# Patient Record
Sex: Female | Born: 1970 | Race: Black or African American | Hispanic: No | Marital: Married | Smoking: Never smoker
Health system: Southern US, Community
[De-identification: ages and names within clinical notes are randomized; demographics above are authoritative.]

## PROBLEM LIST (undated history)

## (undated) DIAGNOSIS — E7800 Pure hypercholesterolemia, unspecified: Secondary | ICD-10-CM

## (undated) DIAGNOSIS — I10 Essential (primary) hypertension: Secondary | ICD-10-CM

## (undated) DIAGNOSIS — R7303 Prediabetes: Secondary | ICD-10-CM

## (undated) HISTORY — DX: Prediabetes: R73.03

---

## 1999-12-01 ENCOUNTER — Emergency Department (HOSPITAL_COMMUNITY): Admission: EM | Admit: 1999-12-01 | Discharge: 1999-12-01 | Payer: Self-pay | Admitting: Emergency Medicine

## 2001-04-07 ENCOUNTER — Ambulatory Visit (HOSPITAL_COMMUNITY): Admission: RE | Admit: 2001-04-07 | Discharge: 2001-04-07 | Payer: Self-pay | Admitting: *Deleted

## 2001-04-09 ENCOUNTER — Encounter: Admission: RE | Admit: 2001-04-09 | Discharge: 2001-04-09 | Payer: Self-pay | Admitting: Obstetrics & Gynecology

## 2001-04-23 ENCOUNTER — Encounter: Admission: RE | Admit: 2001-04-23 | Discharge: 2001-04-23 | Payer: Self-pay | Admitting: *Deleted

## 2001-04-30 ENCOUNTER — Encounter: Admission: RE | Admit: 2001-04-30 | Discharge: 2001-04-30 | Payer: Self-pay | Admitting: *Deleted

## 2001-05-07 ENCOUNTER — Encounter: Admission: RE | Admit: 2001-05-07 | Discharge: 2001-05-07 | Payer: Self-pay | Admitting: *Deleted

## 2001-05-20 ENCOUNTER — Ambulatory Visit (HOSPITAL_COMMUNITY): Admission: RE | Admit: 2001-05-20 | Discharge: 2001-05-20 | Payer: Self-pay | Admitting: *Deleted

## 2001-05-21 ENCOUNTER — Encounter: Admission: RE | Admit: 2001-05-21 | Discharge: 2001-05-21 | Payer: Self-pay | Admitting: *Deleted

## 2001-06-04 ENCOUNTER — Encounter: Admission: RE | Admit: 2001-06-04 | Discharge: 2001-06-04 | Payer: Self-pay | Admitting: *Deleted

## 2001-06-25 ENCOUNTER — Encounter: Admission: RE | Admit: 2001-06-25 | Discharge: 2001-06-25 | Payer: Self-pay | Admitting: *Deleted

## 2001-07-09 ENCOUNTER — Encounter: Admission: RE | Admit: 2001-07-09 | Discharge: 2001-07-09 | Payer: Self-pay | Admitting: *Deleted

## 2001-07-16 ENCOUNTER — Encounter: Admission: RE | Admit: 2001-07-16 | Discharge: 2001-07-16 | Payer: Self-pay | Admitting: *Deleted

## 2001-07-23 ENCOUNTER — Ambulatory Visit (HOSPITAL_COMMUNITY): Admission: RE | Admit: 2001-07-23 | Discharge: 2001-07-23 | Payer: Self-pay | Admitting: *Deleted

## 2001-07-30 ENCOUNTER — Encounter: Admission: RE | Admit: 2001-07-30 | Discharge: 2001-07-30 | Payer: Self-pay | Admitting: *Deleted

## 2001-08-06 ENCOUNTER — Encounter: Admission: RE | Admit: 2001-08-06 | Discharge: 2001-08-06 | Payer: Self-pay | Admitting: *Deleted

## 2001-08-13 ENCOUNTER — Encounter: Admission: RE | Admit: 2001-08-13 | Discharge: 2001-08-13 | Payer: Self-pay | Admitting: *Deleted

## 2001-08-20 ENCOUNTER — Encounter: Payer: Self-pay | Admitting: *Deleted

## 2001-08-20 ENCOUNTER — Inpatient Hospital Stay (HOSPITAL_COMMUNITY): Admission: AD | Admit: 2001-08-20 | Discharge: 2001-08-20 | Payer: Self-pay | Admitting: *Deleted

## 2001-08-20 ENCOUNTER — Encounter: Admission: RE | Admit: 2001-08-20 | Discharge: 2001-08-20 | Payer: Self-pay | Admitting: *Deleted

## 2001-08-20 ENCOUNTER — Encounter (HOSPITAL_COMMUNITY): Admission: RE | Admit: 2001-08-20 | Discharge: 2001-08-20 | Payer: Self-pay | Admitting: *Deleted

## 2001-08-27 ENCOUNTER — Encounter: Admission: RE | Admit: 2001-08-27 | Discharge: 2001-08-27 | Payer: Self-pay | Admitting: *Deleted

## 2001-08-30 ENCOUNTER — Inpatient Hospital Stay (HOSPITAL_COMMUNITY): Admission: AD | Admit: 2001-08-30 | Discharge: 2001-09-04 | Payer: Self-pay | Admitting: *Deleted

## 2001-08-30 ENCOUNTER — Encounter (INDEPENDENT_AMBULATORY_CARE_PROVIDER_SITE_OTHER): Payer: Self-pay

## 2002-01-15 ENCOUNTER — Emergency Department (HOSPITAL_COMMUNITY): Admission: EM | Admit: 2002-01-15 | Discharge: 2002-01-15 | Payer: Self-pay | Admitting: Emergency Medicine

## 2003-09-14 ENCOUNTER — Emergency Department (HOSPITAL_COMMUNITY): Admission: EM | Admit: 2003-09-14 | Discharge: 2003-09-14 | Payer: Self-pay | Admitting: Emergency Medicine

## 2004-09-10 ENCOUNTER — Emergency Department (HOSPITAL_COMMUNITY): Admission: EM | Admit: 2004-09-10 | Discharge: 2004-09-10 | Payer: Self-pay | Admitting: Emergency Medicine

## 2005-03-06 ENCOUNTER — Ambulatory Visit (HOSPITAL_COMMUNITY): Admission: RE | Admit: 2005-03-06 | Discharge: 2005-03-06 | Payer: Self-pay | Admitting: *Deleted

## 2005-04-17 ENCOUNTER — Ambulatory Visit (HOSPITAL_COMMUNITY): Admission: RE | Admit: 2005-04-17 | Discharge: 2005-04-17 | Payer: Self-pay | Admitting: *Deleted

## 2006-07-09 ENCOUNTER — Emergency Department (HOSPITAL_COMMUNITY): Admission: EM | Admit: 2006-07-09 | Discharge: 2006-07-09 | Payer: Self-pay | Admitting: Emergency Medicine

## 2009-05-10 ENCOUNTER — Emergency Department (HOSPITAL_COMMUNITY): Admission: EM | Admit: 2009-05-10 | Discharge: 2009-05-10 | Payer: Self-pay | Admitting: Emergency Medicine

## 2010-01-07 ENCOUNTER — Emergency Department (HOSPITAL_COMMUNITY): Admission: EM | Admit: 2010-01-07 | Discharge: 2010-01-07 | Payer: Self-pay | Admitting: Emergency Medicine

## 2010-06-04 LAB — COMPREHENSIVE METABOLIC PANEL
AST: 17 U/L (ref 0–37)
Albumin: 4 g/dL (ref 3.5–5.2)
BUN: 9 mg/dL (ref 6–23)
CO2: 25 mEq/L (ref 19–32)
Calcium: 9.6 mg/dL (ref 8.4–10.5)
Chloride: 105 mEq/L (ref 96–112)
Creatinine, Ser: 0.84 mg/dL (ref 0.4–1.2)
GFR calc Af Amer: >60 mL/min (ref >60)
GFR calc non Af Amer: >60 mL/min (ref >60)
Sodium: 138 mEq/L (ref 135–145)
Total Protein: 7.8 g/dL (ref 6.0–8.3)

## 2010-06-04 LAB — POCT CARDIAC MARKERS: Troponin i, poc: <0.05 ng/mL (ref 0.00–0.09)

## 2010-06-04 LAB — CBC
MCHC: 33.6 g/dL (ref 30.0–36.0)
MCV: 84.5 fL (ref 78.0–100.0)
RBC: 4.14 MIL/uL (ref 3.87–5.11)

## 2010-06-04 LAB — DIFFERENTIAL
Lymphocytes Relative: 35 % (ref 12–46)
Lymphs Abs: 2.8 10*3/uL (ref 0.7–4.0)
Monocytes Absolute: 0.7 10*3/uL (ref 0.1–1.0)

## 2010-07-28 NOTE — H&P (Signed)
[facility]  Patient:    Brianna Serrano, Brianna Serrano Visit Number: [id number] MRN: [id number]          Service Type: OBS Location: [facility] Attending Physician:  Michaelle Copas Dictated by:   Clement Husbands, M.D. Admit Date:  08/30/2001                           History and Physical  PREOPERATIVE HISTORY AND PHYSICAL  HISTORY OF PRESENT ILLNESS:   This 40 year old black female, gravida 7, para 5-0-1-5, last menstrual period November 29, 2000, with an [facility] based on a 19-odd-week ultrasound of August 26, 2001, was followed in the high-risk clinic. She is a gestational diabetic, diet controlled.  LABORATORY WORK:              Blood type O positive, negative antibody screen. Rubella immune.  Hemoglobin negative.  Syphilis nonreactive.  Gonorrhea negative.  Chlamydia negative.  GBS negative.  Sickle cell negative.  The patient presented to triage in early latent labor.  The cervix at that time was 2-cm dilated, 75% effacement, vertex at -3 station.  During the afternoon because an augmentation of labor was instituted.  At 5:10 she was having contractions every 2-1/2 to 5 minutes on 4 MU of Pitocin.  Cervix was 2- to 3-cm dilated, 70-80% effacement, vertex still at -3 station.  She had spontaneous rupture of membranes of clear fluid at 7:07 p.m.  She was contracting every 2-3 minutes.  Variables to the 90-110 beats per minute and had recently started with her last contractions.  Position change was instituted.  Her cervix was 3 cm, 70% effaced, -2.  Variable decelerations continued with each contraction to the 85-95 beat per minute range.  There was good variability.  At 8:40 p.m. she was 7+ cm dilated.  Molding was noted. She was thought to be in a transverse position.  Intrauterine pressure catheter was inserted and was internal scalp electrode.  At 11:30 p.m. the cervix was more swollen and the cervix could be felt all the way around. There was more  molding, the vertex was at -1/0 station.  Because the variable decelerations were deeper, Pitocin was discontinued.  Attempts were made over the next few hours to change position in hopes of getting the head to rotate. It was thought to be LOP position.  The variable decelerations improved somewhat, but did pretty much persist.  It would sometimes go to the 80-90 beat per minute range and sometimes to the 60-70 beat per minute range. Variability was considered good.  Her exam at 3:15 a.m. on June 22, revealed the cervix to be 8-9 cm and more edematous anterior.  There was more caput molding and there was suture overlap noted.  She was prepared for cesarean section.  PHYSICAL EXAMINATION:  VITAL SIGNS:                  Blood pressure normal.  CHEST:                        Clear to auscultation and percussion.  HEART:                        Regular sinus rhythm without murmurs.  ABDOMEN:                      Gravid.  Fetal heart rates are noted  above.  VAGINAL EXAMINATION:          Likewise noted above.  DIAGNOSES: 1. Term pregnancy at 40-5/7ths weeks. 2. Arrest at active phase of labor with persistent occipitoposterior    presentation and variable decelerations. Dictated by:   Clement Husbands, M.D. Attending Physician:  Michaelle Copas DD:  08/31/01 TD:  08/31/01 Job: [id number]

## 2010-07-28 NOTE — Op Note (Signed)
[facility]  Patient:    Brianna Serrano, PAIS Visit Number: [id number] MRN: [id number]          Service Type: ANT Location: [facility] Attending Physician:  Michaelle Copas Dictated by:   Clement Husbands, M.D. Proc. Date: 08/31/01 Admit Date:  08/20/2001 Discharge Date: 08/20/2001                             Operative Report  PREOPERATIVE DIAGNOSES:       1. Term pregnancy.                               2. Arrest of active phase of labor with                                  persistent occiput presentation with variable                                  decelerations.  POSTOPERATIVE DIAGNOSES:      1. Term pregnancy.                               2. Arrest of active phase of labor with                                  persistent occiput presentation with variable                                  decelerations.  OPERATION:                    Low transverse cervical cesarean section.  SURGEON:                      Clement Husbands, M.D., Conni Elliot, M.D.  ANESTHESIA:                   Epidural.  PROCEDURE:                    With the patient under satisfactory epidural anesthesia in supine position leaning slightly to her left, already catheterized, the abdomen was prepped and draped.  A low abdominal transverse skin incision was made and carried down through a thin subcutaneous layer to the rectus fascia which was sharply and transversely divided.  Peritoneal cavity was entered.  It should be noted that the skin incision was made three fingerbreadths above the pubic symphysis.  The bladder reflection was higher than normal.  The vesicouterine peritoneum was transversely incised and the bladder pushed inferiorly.  Lower segment transverse uterine incision was made and was extended bilaterally with the bandage scissors.  The lower segment was fairly much thinned out.  Vertex was noted to be LOP position.  The head was easily delivered  followed by the rest of the baby.  Spontaneous respirations and crying were noted.  Cord was doubly clamped and divided.  Infant shown to the mother and passed on to the pediatricians.  Cord blood was obtained as was  blood for arterial cord pH.  Placenta was manually removed.  Uterus was explored and was normal.  The uterus was lifted out of the abdominal cavity.  Uterine incision was closed with a running locking 0 Vicryl suture.  Second layer embrocated the first.  One bleeding site towards the left side required a little extra suturing.  Suture line was then irrigated.  Hemostasis was good.  1 and 2-0 Vicryl on a small needle was used suture one small bleeder down by the bladder.  Fallopian tubes and ovaries were visualized and were normal.  The uterus was repositioned in the abdominal cavity.  Vesicouterine peritoneum was reapproximated with a running 2-0 Vicryl suture.  Suture line was again irrigated.  Hemostasis was good.  Anterior peritoneum was closed with a running 0 Vicryl.  The recti muscles at the lower part of the incision were approximated with interrupted Vicryl suture.  Rectus fascia closed with two segments of running 0 Vicryl suture. Subcutaneous tissue layer was dry.  Skin edge approximated with wide skin staples.  Estimated blood loss 700-800 cc.  Sponge and needle count was correct.  The patient tolerated the procedure well.  Was returned to the recovery room in satisfactory condition. Dictated by:   Clement Husbands, M.D. Attending Physician:  Michaelle Copas DD:  08/31/01 TD:  09/01/01 Job: [id number]

## 2010-07-28 NOTE — Discharge Summary (Signed)
   NAMETASHEEMA, PERRONE NO.:  [id number]   MEDICAL RECORD NO.:  [id number]                   PATIENT TYPE:  NP   LOCATION:  9102                                 FACILITY:  [facility]   PHYSICIAN:  Clement Husbands, M.D.         DATE OF BIRTH:  10-Oct-1970   DATE OF ADMISSION:  08/31/2001  DATE OF DISCHARGE:  09/04/2001                                 DISCHARGE SUMMARY   HOSPITAL COURSE:  [age]-year-old black female, gravida 7, para 5-0-1-5, was  admitted June 22nd with a term pregnancy of 40-5/7 weeks.  She had been  followed in the high-risk clinic.  The patient is a gestational diabetic,  diet controlled.  Prenatal lab work as well as labor course are in the  dictated history and physical exam.  With a diagnosis of arrest of active  phase of labor, with a persistent occipitoposterior presentation and  variable decelerations, she underwent a low transverse cervical cesarean  section with delivery of a 6-pound 10-ounce female infant with Apgar of 8/8.  Arterial cord pH was 7.31.  Postoperatively, she had what was thought to be  a little bit of endometriosis; she had a low-grade fever.  She was treated  with antibiotics.  She did well, progressed to a regular diet and ambulation  as well as passing flatus.  She was discharged on her fourth postoperative  day in an improved condition, to be seen in [facility] in four to six  weeks.  The incision was healing well and skin staples were removed by the  nurse.   DISCHARGE MEDICATIONS:  Given prescription for Percocet and was continued on  antibiotics, Augmentin 500 mg, to take three times a day.   FINAL DIAGNOSES:  1. Term pregnancy.  2. Arrest of active phase of labor with persistent occipitoposterior     presentation with variable decelerations.                                               Clement Husbands, M.D.    EFR/MEDQ  D:  10/09/2001  T:  10/15/2001  Job:  [id number]

## 2010-12-19 ENCOUNTER — Emergency Department (HOSPITAL_COMMUNITY)
Admission: EM | Admit: 2010-12-19 | Discharge: 2010-12-19 | Disposition: A | Discharge status: Home / Self Care | Payer: No Typology Code available for payment source | Attending: Emergency Medicine | Admitting: Emergency Medicine

## 2010-12-19 DIAGNOSIS — M62838 Other muscle spasm: Secondary | ICD-10-CM | POA: Insufficient documentation

## 2010-12-19 DIAGNOSIS — M545 Low back pain, unspecified: Secondary | ICD-10-CM | POA: Insufficient documentation

## 2010-12-19 DIAGNOSIS — R51 Headache: Secondary | ICD-10-CM | POA: Insufficient documentation

## 2010-12-19 DIAGNOSIS — T1490XA Injury, unspecified, initial encounter: Secondary | ICD-10-CM | POA: Insufficient documentation

## 2010-12-19 DIAGNOSIS — Y92410 Unspecified street and highway as the place of occurrence of the external cause: Secondary | ICD-10-CM | POA: Insufficient documentation

## 2011-07-30 ENCOUNTER — Other Ambulatory Visit (HOSPITAL_COMMUNITY): Payer: Self-pay | Admitting: Physician Assistant

## 2011-07-30 DIAGNOSIS — Z1231 Encounter for screening mammogram for malignant neoplasm of breast: Secondary | ICD-10-CM

## 2011-07-31 ENCOUNTER — Ambulatory Visit (HOSPITAL_COMMUNITY)
Admission: RE | Admit: 2011-07-31 | Discharge: 2011-07-31 | Disposition: A | Discharge status: Home / Self Care | Payer: Self-pay | Source: Ambulatory Visit | Attending: Physician Assistant | Admitting: Physician Assistant

## 2011-07-31 DIAGNOSIS — Z1231 Encounter for screening mammogram for malignant neoplasm of breast: Secondary | ICD-10-CM

## 2011-12-24 ENCOUNTER — Other Ambulatory Visit: Payer: Self-pay | Admitting: Family Medicine

## 2011-12-24 DIAGNOSIS — R102 Pelvic and perineal pain: Secondary | ICD-10-CM

## 2011-12-24 DIAGNOSIS — D369 Benign neoplasm, unspecified site: Secondary | ICD-10-CM

## 2011-12-28 ENCOUNTER — Ambulatory Visit
Admission: RE | Admit: 2011-12-28 | Discharge: 2011-12-28 | Disposition: A | Discharge status: Home / Self Care | Payer: No Typology Code available for payment source | Source: Ambulatory Visit | Attending: Family Medicine | Admitting: Family Medicine

## 2011-12-28 DIAGNOSIS — R102 Pelvic and perineal pain: Secondary | ICD-10-CM

## 2011-12-28 DIAGNOSIS — D369 Benign neoplasm, unspecified site: Secondary | ICD-10-CM

## 2015-09-07 ENCOUNTER — Other Ambulatory Visit: Payer: Self-pay | Admitting: Infectious Disease

## 2015-09-07 DIAGNOSIS — Z1231 Encounter for screening mammogram for malignant neoplasm of breast: Secondary | ICD-10-CM

## 2015-09-08 ENCOUNTER — Ambulatory Visit
Admission: RE | Admit: 2015-09-08 | Discharge: 2015-09-08 | Disposition: A | Discharge status: Home / Self Care | Payer: No Typology Code available for payment source | Source: Ambulatory Visit | Attending: Infectious Disease | Admitting: Infectious Disease

## 2015-09-08 DIAGNOSIS — Z1231 Encounter for screening mammogram for malignant neoplasm of breast: Secondary | ICD-10-CM

## 2015-09-14 ENCOUNTER — Other Ambulatory Visit: Payer: Self-pay | Admitting: Infectious Disease

## 2015-09-14 DIAGNOSIS — R928 Other abnormal and inconclusive findings on diagnostic imaging of breast: Secondary | ICD-10-CM

## 2015-09-19 ENCOUNTER — Other Ambulatory Visit: Payer: Self-pay | Admitting: Nurse Practitioner

## 2015-09-19 DIAGNOSIS — R928 Other abnormal and inconclusive findings on diagnostic imaging of breast: Secondary | ICD-10-CM

## 2015-09-20 ENCOUNTER — Other Ambulatory Visit: Payer: No Typology Code available for payment source

## 2015-11-08 ENCOUNTER — Other Ambulatory Visit (HOSPITAL_COMMUNITY): Payer: Self-pay | Admitting: *Deleted

## 2015-11-08 DIAGNOSIS — R928 Other abnormal and inconclusive findings on diagnostic imaging of breast: Secondary | ICD-10-CM

## 2015-11-17 ENCOUNTER — Encounter (HOSPITAL_COMMUNITY): Payer: Self-pay

## 2015-11-17 ENCOUNTER — Ambulatory Visit
Admission: RE | Admit: 2015-11-17 | Discharge: 2015-11-17 | Disposition: A | Discharge status: Home / Self Care | Payer: No Typology Code available for payment source | Source: Ambulatory Visit | Attending: Obstetrics and Gynecology | Admitting: Obstetrics and Gynecology

## 2015-11-17 ENCOUNTER — Ambulatory Visit (HOSPITAL_COMMUNITY)
Admission: RE | Admit: 2015-11-17 | Discharge: 2015-11-17 | Disposition: A | Discharge status: Home / Self Care | Payer: Self-pay | Source: Ambulatory Visit | Attending: Obstetrics and Gynecology | Admitting: Obstetrics and Gynecology

## 2015-11-17 VITALS — BP 112/70 | Temp 98.1°F | Ht 63.0 in | Wt 167.0 lb

## 2015-11-17 DIAGNOSIS — Z1239 Encounter for other screening for malignant neoplasm of breast: Secondary | ICD-10-CM

## 2015-11-17 DIAGNOSIS — R928 Other abnormal and inconclusive findings on diagnostic imaging of breast: Secondary | ICD-10-CM

## 2015-11-17 NOTE — Patient Instructions (Signed)
Explained breast self awareness to [employer]. Patient did not need a Pap smear today due to last Pap smear was in July 2017 per patient. Let her know BCCCP will cover Pap smears every 3 years unless has a history of abnormal Pap smears. Referred patient to the [facility] for a left breast diagnostic mammogram and possible ultrasound per recommendation. Appointment scheduled for Thursday, November 17, 2015 at 1350. Chauntelle Basques verbalized understanding.  Skylen Danielsen, Arvil Chaco, RN 2:24 PM

## 2015-11-17 NOTE — Progress Notes (Signed)
Patient referred to [facility] by the [place] due to needing additional imaging of the left breast. Screening mammogram completed 08/29/2015.  Pap Smear: Pap smear not completed today. Last Pap smear was in July 2017 at [facility] and normal per patient. Per patient has no history of an abnormal Pap smear. No Pap smear results are in EPIC.  Physical exam: Breasts Breasts symmetrical. No skin abnormalities bilateral breasts. No nipple retraction bilateral breasts. No nipple discharge bilateral breasts. No lymphadenopathy. No lumps palpated bilateral breasts. No complaints of pain or tenderness on exam. Referred patient to the [place] for a left breast diagnostic mammogram and possible ultrasound per recommendation. Appointment scheduled for Thursday, November 17, 2015 at 1350.        Pelvic/Bimanual No Pap smear completed today since last Pap smear was in July 2017 per patient. Pap smear not indicated per BCCCP guidelines.   Smoking History: Patient has never smoked.  Patient Navigation: Patient education provided. Access to services provided for patient through [facility] program.

## 2015-11-18 ENCOUNTER — Encounter (HOSPITAL_COMMUNITY): Payer: Self-pay | Admitting: *Deleted

## 2015-12-06 ENCOUNTER — Encounter (HOSPITAL_COMMUNITY): Payer: Self-pay | Admitting: *Deleted

## 2018-12-30 ENCOUNTER — Ambulatory Visit: Payer: Self-pay | Admitting: Emergency Medicine

## 2018-12-30 ENCOUNTER — Other Ambulatory Visit: Payer: Self-pay

## 2019-03-16 ENCOUNTER — Ambulatory Visit: Payer: Self-pay | Attending: Internal Medicine

## 2019-03-16 DIAGNOSIS — Z20822 Contact with and (suspected) exposure to covid-19: Secondary | ICD-10-CM | POA: Insufficient documentation

## 2019-03-17 LAB — NOVEL CORONAVIRUS, NAA: SARS-CoV-2, NAA: NOT DETECTED

## 2019-07-04 ENCOUNTER — Ambulatory Visit: Payer: Self-pay | Attending: Internal Medicine

## 2019-07-04 DIAGNOSIS — Z23 Encounter for immunization: Secondary | ICD-10-CM

## 2019-07-04 NOTE — Progress Notes (Signed)
   Covid-19 Vaccination Clinic  Name:  Brianna Serrano    MRN: [id number] DOB: November 20, 1970  07/04/2019  Ms. Consoli was observed post Covid-19 immunization for 15 minutes without incident. She was provided with Vaccine Information Sheet and instruction to access the V-Safe system.   Ms. Iwata was instructed to call 911 with any severe reactions post vaccine: Marland Kitchen Difficulty breathing  . Swelling of face and throat  . A fast heartbeat  . A bad rash all over body  . Dizziness and weakness   Immunizations Administered    Name Date Dose VIS Date Route   Pfizer COVID-19 Vaccine 07/04/2019  2:36 PM 0.3 mL 05/06/2018 Intramuscular   Manufacturer: [place]   Lot: [id number]   [place]: [id number]

## 2019-08-01 ENCOUNTER — Ambulatory Visit: Payer: Self-pay | Attending: Internal Medicine

## 2019-08-01 DIAGNOSIS — Z23 Encounter for immunization: Secondary | ICD-10-CM

## 2019-08-01 NOTE — Progress Notes (Signed)
2  Covid-19 Vaccination Clinic  Name:  Brianna Serrano    MRN: [id number] DOB: 06-May-1970  08/01/2019  [name] was observed post Covid-19 immunization for 15 minutes without incident. She was provided with Vaccine Information Sheet and instruction to access the V-Safe system.   [name] was instructed to call 911 with any severe reactions post vaccine: Marland Kitchen Difficulty breathing  . Swelling of face and throat  . A fast heartbeat  . A bad rash all over body  . Dizziness and weakness   Immunizations Administered    Name Date Dose VIS Date Route   Pfizer COVID-19 Vaccine 08/01/2019  1:51 PM 0.3 mL 05/06/2018 Intramuscular   Manufacturer: [employer]   Lot: [id number]   [place]: [id number]

## 2020-11-02 ENCOUNTER — Emergency Department (HOSPITAL_COMMUNITY): Payer: 59

## 2020-11-02 ENCOUNTER — Encounter (HOSPITAL_COMMUNITY): Payer: Self-pay | Admitting: Emergency Medicine

## 2020-11-02 ENCOUNTER — Emergency Department (HOSPITAL_COMMUNITY)
Admission: EM | Admit: 2020-11-02 | Discharge: 2020-11-03 | Disposition: A | Discharge status: Home / Self Care | Payer: 59 | Attending: Emergency Medicine | Admitting: Emergency Medicine

## 2020-11-02 ENCOUNTER — Other Ambulatory Visit: Payer: Self-pay

## 2020-11-02 DIAGNOSIS — I10 Essential (primary) hypertension: Secondary | ICD-10-CM | POA: Diagnosis not present

## 2020-11-02 DIAGNOSIS — U071 COVID-19: Secondary | ICD-10-CM | POA: Diagnosis not present

## 2020-11-02 DIAGNOSIS — R0602 Shortness of breath: Secondary | ICD-10-CM | POA: Diagnosis present

## 2020-11-02 DIAGNOSIS — R0789 Other chest pain: Secondary | ICD-10-CM

## 2020-11-02 HISTORY — DX: Essential (primary) hypertension: I10

## 2020-11-02 HISTORY — DX: Pure hypercholesterolemia, unspecified: E78.00

## 2020-11-02 LAB — CBC
HCT: 33.9 % — ABNORMAL LOW (ref 36.0–46.0)
Hemoglobin: 10.9 g/dL — ABNORMAL LOW (ref 12.0–15.0)
MCH: 26.3 pg (ref 26.0–34.0)
MCHC: 32.2 g/dL (ref 30.0–36.0)
MCV: 81.7 fL (ref 80.0–100.0)
Platelets: 281 10*3/uL (ref 150–400)
RBC: 4.15 MIL/uL (ref 3.87–5.11)
RDW: 14.6 % (ref 11.5–15.5)
WBC: 6.2 10*3/uL (ref 4.0–10.5)
nRBC: 0 % (ref 0.0–0.2)

## 2020-11-02 LAB — BASIC METABOLIC PANEL
Anion gap: 8 (ref 5–15)
BUN: 9 mg/dL (ref 6–20)
CO2: 26 mmol/L (ref 22–32)
Calcium: 9.3 mg/dL (ref 8.9–10.3)
Chloride: 104 mmol/L (ref 98–111)
Creatinine, Ser: 0.82 mg/dL (ref 0.44–1.00)
GFR, Estimated: >60 mL/min (ref >60)
Glucose, Bld: 169 mg/dL — ABNORMAL HIGH (ref 70–99)
Potassium: 3.6 mmol/L (ref 3.5–5.1)
Sodium: 138 mmol/L (ref 135–145)

## 2020-11-02 LAB — TROPONIN I (HIGH SENSITIVITY)
Troponin I (High Sensitivity): <2 ng/L (ref <18)
Troponin I (High Sensitivity): 3 ng/L (ref <18)

## 2020-11-02 LAB — I-STAT BETA HCG BLOOD, ED (MC, WL, AP ONLY): I-stat hCG, quantitative: <5 m[IU]/mL (ref <5)

## 2020-11-02 LAB — RESP PANEL BY RT-PCR (FLU A&B, COVID) ARPGX2
Influenza A by PCR: NEGATIVE
Influenza B by PCR: NEGATIVE
SARS Coronavirus 2 by RT PCR: POSITIVE — AB

## 2020-11-02 MED ORDER — IOHEXOL 350 MG/ML SOLN
80.0000 mL | Freq: Once | INTRAVENOUS | Status: AC | PRN
  Administered 2020-11-02: 80 mL via INTRAVENOUS

## 2020-11-02 MED ORDER — KETOROLAC TROMETHAMINE 30 MG/ML IJ SOLN: 30.0000 mg | Freq: Once | INTRAMUSCULAR | Status: DC

## 2020-11-02 MED ORDER — BEBTELOVIMAB 175 MG/2 ML IV (EUA)
175.0000 mg | Freq: Once | INTRAMUSCULAR | Status: AC
  Administered 2020-11-03: 175 mg via INTRAVENOUS
  Filled 2020-11-02: qty 2

## 2020-11-02 MED ORDER — SODIUM CHLORIDE 0.9 % IV BOLUS
1000.0000 mL | Freq: Once | INTRAVENOUS | Status: AC
  Administered 2020-11-02: 1000 mL via INTRAVENOUS

## 2020-11-02 NOTE — ED Triage Notes (Signed)
Pt here for sob x1 week that has been getting worse. Reports cp when taking a deep breath, pain radiates into back and left shoulder.

## 2020-11-02 NOTE — ED Provider Notes (Signed)
Emergency Medicine Provider Triage Evaluation Note  Brianna Serrano , a 50 y.o. female  was evaluated in triage.  Pt complains of cp.  Review of Systems  Positive: Cp, upper back pain Negative: Fever, chills, runny nose, sneeze, cough, sore throat, n/v/d, abd pain, sob  Physical Exam  BP (!) 175/94   Pulse 79   Temp 98 F (36.7 C)   Resp 16   Ht '5\' 5"'$  (1.651 m)   Wt 78 kg   SpO2 100%   BMI 28.62 kg/m  Gen:   Awake, no distress   Resp:  Normal effort  MSK:   Moves extremities without difficulty  Other:    Medical Decision Making  Medically screening exam initiated at 7:35 PM.  Appropriate orders placed.  [name] was informed that the remainder of the evaluation will be completed by another provider, this initial triage assessment does not replace that evaluation, and the importance of remaining in the ED until their evaluation is complete.  Recurrent L sided chest pain worse with deep breath, or with movement x 1 week.  No significant cardiac hx.  No covid sxs. No hx of PE, or risk factors   Domenic Moras, PA-C 11/02/20 1941    Lajean Saver, MD 11/02/20 [phone]

## 2020-11-02 NOTE — ED Provider Notes (Signed)
[facility] EMERGENCY DEPARTMENT Provider Note   CSN: [id number] Arrival date & time: 11/02/20  1912     History Chief Complaint  Patient presents with   Shortness of Breath   Chest Pain    Brianna Serrano is a 50 y.o. female hx of HL, HTN, here with shortness of breath and chest pain.  Patient has pleuritic chest pain for the last week or so.  Patient also noticed some shortness of breath as well. Patient states that it is worse when she takes a deep breath.  Denies any leg swelling or recent travel or history of blood clots.  Denies any cardiac history.  The history is provided by the patient.      Past Medical History:  Diagnosis Date   Hypercholesteremia    Hypertension     There are no problems to display for this patient.   Past Surgical History:  Procedure Laterality Date   CESAREAN SECTION       OB History     Gravida  7   Para  6   Term  6   Preterm      AB  1   Living  6      SAB  1   IAB      Ectopic      Multiple      Live Births  6           History reviewed. No pertinent family history.  Social History   Tobacco Use   Smoking status: Never   Smokeless tobacco: Never  Substance Use Topics   Alcohol use: No   Drug use: No    Home Medications Prior to Admission medications   Not on File    Allergies    Patient has no known allergies.  Review of Systems   Review of Systems  Respiratory:  Positive for shortness of breath.   All other systems reviewed and are negative.  Physical Exam Updated Vital Signs BP (!) 147/80   Pulse 72   Temp 98 F (36.7 C)   Resp 18   Ht '5\' 5"'$  (1.651 m)   Wt 78 kg   SpO2 100%   BMI 28.62 kg/m   Physical Exam Vitals and nursing note reviewed.  HENT:     Head: Normocephalic.  Cardiovascular:     Rate and Rhythm: Normal rate and regular rhythm.  Pulmonary:     Effort: Pulmonary effort is normal.     Breath sounds: Normal breath sounds.  Chest:     Comments: No  reproducible tenderness Abdominal:     General: Bowel sounds are normal.     Palpations: Abdomen is soft.  Musculoskeletal:        General: Normal range of motion.     Cervical back: Normal range of motion.  Skin:    General: Skin is warm.     Capillary Refill: Capillary refill takes less than 2 seconds.  Neurological:     General: No focal deficit present.     Mental Status: She is alert and oriented to person, place, and time.  Psychiatric:        Mood and Affect: Mood normal.        Behavior: Behavior normal.    ED Results / Procedures / Treatments   Labs (all labs ordered are listed, but only abnormal results are displayed) Labs Reviewed  RESP PANEL BY RT-PCR (FLU A&B, COVID) ARPGX2 - Abnormal; Notable for the following components:  Result Value   SARS Coronavirus 2 by RT PCR POSITIVE (*)    All other components within normal limits  BASIC METABOLIC PANEL - Abnormal; Notable for the following components:   Glucose, Bld 169 (*)    All other components within normal limits  CBC - Abnormal; Notable for the following components:   Hemoglobin 10.9 (*)    HCT 33.9 (*)    All other components within normal limits  I-STAT BETA HCG BLOOD, ED ([facility], [facility], [facility] ONLY)  TROPONIN I (HIGH SENSITIVITY)  TROPONIN I (HIGH SENSITIVITY)    EKG EKG Interpretation  Date/Time:  Wednesday November 02 2020 19:22:14 EDT Ventricular Rate:  83 PR Interval:  136 QRS Duration: 76 QT Interval:  356 QTC Calculation: 418 R Axis:   69 Text Interpretation: Normal sinus rhythm Normal ECG No significant change since last tracing Confirmed by Wandra Arthurs [phone]) on 11/02/2020 9:14:52 PM  Radiology DG Chest 2 View  Result Date: 11/02/2020 CLINICAL DATA:  Dyspnea, chest pain EXAM: CHEST - 2 VIEW COMPARISON:  05/10/2009 FINDINGS: The heart size and mediastinal contours are within normal limits. Both lungs are clear. The visualized skeletal structures are unremarkable. IMPRESSION: No active  cardiopulmonary disease. Electronically Signed   By: Fidela Salisbury M.D.   On: 11/02/2020 20:10    Procedures Procedures   Medications Ordered in ED Medications  bebtelovimab EUA injection SOLN 175 mg (has no administration in time range)  sodium chloride 0.9 % bolus 1,000 mL (1,000 mLs Intravenous New Bag/Given 11/02/20 2251)    ED Course  I have reviewed the triage vital signs and the nursing notes.  Pertinent labs & imaging results that were available during my care of the patient were reviewed by me and considered in my medical decision making (see chart for details).    MDM Rules/Calculators/A&P                           Brianna Serrano is a 50 y.o. female here with shortness of breath and pleuritic chest pain.  Concern for possible PE.  Symptoms for about a week and low suspicion for ACS. Will get troponin x2 and CT PE study.  Also consider COVID as well.   11:21 PM Patient's COVID is positive. Ordered antibody infusion. CTA PE pending. Signed out to Dr. Wyvonnia Dusky in the ED. Anticipate dc home if CT showed no PE. Second trop negative.    Final Clinical Impression(s) / ED Diagnoses Final diagnoses:  None    Rx / DC Orders ED Discharge Orders     None        Drenda Freeze, MD 11/02/20 [phone]

## 2020-11-02 NOTE — ED Provider Notes (Signed)
Care assumed from Dr. Darl Householder.  Patient with pleuritic chest pain for the past week or so and shortness of breath.  Found to be COVID-positive.  Pending CT PE study.  She received monoclonal antibody infusion.  She is not hypoxic  CT shows no pulmonary embolism or other acute findings.  She has no hypoxia or increased work of breathing.  Troponin negative x2.  Patient received monoclonal antibody infusion without problem. No hypoxia or increased work of breathing.  Discussed quarantine precautions, antipyretics, p.o. hydration, PCP follow-up.   Ezequiel Essex, MD 11/03/20 [phone]

## 2020-11-03 NOTE — ED Notes (Signed)
Patient verbalizes understanding of discharge instructions. Opportunity for questioning and answers were provided. Armband removed by staff, pt discharged from ED ambulatory.   

## 2020-11-03 NOTE — Discharge Instructions (Addendum)
There is no evidence of heart attack or blood clot in the lung.  You received the antibody infusion for COVID infection.  Keep yourself quarantined for total of 10 days.  Use Tylenol Motrin as needed for aches and fever.  Return to the ED with difficulty breathing, not able to eat or drink, any other concerns

## 2021-07-19 ENCOUNTER — Emergency Department (HOSPITAL_COMMUNITY): Payer: 59

## 2021-07-19 ENCOUNTER — Other Ambulatory Visit: Payer: Self-pay

## 2021-07-19 ENCOUNTER — Emergency Department (HOSPITAL_COMMUNITY)
Admission: EM | Admit: 2021-07-19 | Discharge: 2021-07-19 | Disposition: A | Discharge status: Home / Self Care | Payer: 59 | Attending: Student | Admitting: Student

## 2021-07-19 ENCOUNTER — Encounter (HOSPITAL_COMMUNITY): Payer: Self-pay | Admitting: Emergency Medicine

## 2021-07-19 DIAGNOSIS — M25512 Pain in left shoulder: Secondary | ICD-10-CM | POA: Diagnosis present

## 2021-07-19 DIAGNOSIS — M79622 Pain in left upper arm: Secondary | ICD-10-CM | POA: Insufficient documentation

## 2021-07-19 LAB — TROPONIN I (HIGH SENSITIVITY): Troponin I (High Sensitivity): <2 ng/L (ref <18)

## 2021-07-19 LAB — BASIC METABOLIC PANEL
Anion gap: 7 (ref 5–15)
BUN: 11 mg/dL (ref 6–20)
CO2: 24 mmol/L (ref 22–32)
Calcium: 9.5 mg/dL (ref 8.9–10.3)
Chloride: 107 mmol/L (ref 98–111)
Creatinine, Ser: 0.81 mg/dL (ref 0.44–1.00)
GFR, Estimated: >60 mL/min (ref >60)
Glucose, Bld: 78 mg/dL (ref 70–99)
Potassium: 4 mmol/L (ref 3.5–5.1)
Sodium: 138 mmol/L (ref 135–145)

## 2021-07-19 LAB — CBC WITH DIFFERENTIAL/PLATELET
Abs Immature Granulocytes: 0.01 10*3/uL (ref 0.00–0.07)
Basophils Absolute: 0 10*3/uL (ref 0.0–0.1)
Basophils Relative: 1 %
Eosinophils Absolute: 0.1 10*3/uL (ref 0.0–0.5)
Eosinophils Relative: 1 %
HCT: 35.6 % — ABNORMAL LOW (ref 36.0–46.0)
Hemoglobin: 11 g/dL — ABNORMAL LOW (ref 12.0–15.0)
Immature Granulocytes: 0 %
Lymphocytes Relative: 36 %
Lymphs Abs: 2.4 10*3/uL (ref 0.7–4.0)
MCH: 25.8 pg — ABNORMAL LOW (ref 26.0–34.0)
MCHC: 30.9 g/dL (ref 30.0–36.0)
MCV: 83.4 fL (ref 80.0–100.0)
Monocytes Absolute: 0.6 10*3/uL (ref 0.1–1.0)
Monocytes Relative: 9 %
Neutro Abs: 3.5 10*3/uL (ref 1.7–7.7)
Neutrophils Relative %: 53 %
Platelets: 267 10*3/uL (ref 150–400)
RBC: 4.27 MIL/uL (ref 3.87–5.11)
RDW: 15 % (ref 11.5–15.5)
WBC: 6.6 10*3/uL (ref 4.0–10.5)
nRBC: 0 % (ref 0.0–0.2)

## 2021-07-19 NOTE — ED Provider Triage Note (Signed)
Emergency Medicine Provider Triage Evaluation Note ? ?Brianna Serrano , a 51 y.o. female  was evaluated in triage.  Pt complains of left arm pain for 2-week duration.  Denies chest pain, shortness of breath, palpitations, abdominal pain.  Pain is positional to a degree.  Unable to reproduce pain on exam.  Will add ACS work-up in case this is atypical chest pain. ? ?Review of Systems  ?Positive: As above ?Negative: As above  ? ?Physical Exam  ?BP (!) 155/76 (BP Location: Right Arm)   Pulse 76   Temp 99 ?F (37.2 ?C) (Oral)   Resp 16   SpO2 100%  ?Gen:   Awake, no distress   ?Resp:  Normal effort  ?MSK:   Moves extremities without difficulty  ?Other:  Tenderness to palpation present over left scapula.  Shoulder without tenderness to palpation. ? ?Medical Decision Making  ?Medically screening exam initiated at 3:08 PM.  Appropriate orders placed.  Wilson Sample was informed that the remainder of the evaluation will be completed by another provider, this initial triage assessment does not replace that evaluation, and the importance of remaining in the ED until their evaluation is complete. ? ? ?  ?Evlyn Courier, PA-C ?07/19/21 1512 ? ?

## 2021-07-19 NOTE — ED Provider Notes (Signed)
?[facility] ?Provider Note ? ? ?CSN: [id number] ?Arrival date & time: 07/19/21  1423 ? ?  ? ?History ? ?Chief Complaint  ?Patient presents with  ? Arm Pain  ? ? ?Brianna Serrano is a 51 y.o. female who presents today for evaluation of 2 weeks of constant pain in her left axilla and left shoulder.  She states that this used to be intermittent and she has had similar episodes before when she was sick and coughing.  She reports that the pain is worse with moving.  She does not have any changes in her pain with breathing.  She denies any leg swelling or shortness of breath.  She does not have a history of DVT or PE.  She denies any specific traumatic injury.  She feels like sometimes it gets a little bit better when her husband gives her a massage in that area.  She denies any swelling in her arm.  She does have a Nexplanon in place in that arm that is been there for 3 years, she denies any pain or swelling around the Nexplanon and states she is still able to feel it. ? ?HPI ? ?  ? ?Home Medications ?Prior to Admission medications   ?Not on File  ?   ? ?Allergies    ?Patient has no known allergies.   ? ?Review of Systems   ?Review of Systems ?See HPI ?Physical Exam ?Updated Vital Signs ?BP (!) 155/76 (BP Location: Right Arm)   Pulse 76   Temp 99 ?F (37.2 ?C) (Oral)   Resp 16   SpO2 100%  ?Physical Exam ?Vitals and nursing note reviewed.  ?Constitutional:   ?   General: She is not in acute distress. ?   Appearance: She is not ill-appearing.  ?HENT:  ?   Head: Normocephalic and atraumatic.  ?Eyes:  ?   Conjunctiva/sclera: Conjunctivae normal.  ?Cardiovascular:  ?   Rate and Rhythm: Normal rate.  ?   Pulses: Normal pulses.  ?   Comments: There is no edema of the left upper extremity.  2+ left radial pulse.  Left hand is warm and well-perfused. ?Pulmonary:  ?   Effort: Pulmonary effort is normal. No respiratory distress.  ?Chest:  ?   Comments: There is no tenderness along the left  anterior upper chest.  ?Abdominal:  ?   General: There is no distension.  ?Musculoskeletal:  ?   Cervical back: Normal range of motion and neck supple.  ?   Comments: Patient's pain is recreated and exacerbated with movements of the left arm at the shoulder.  Movements of the left shoulder specifically when the arm is abducted past horizontal causes significant pain and recreates and exacerbates her pain.  There is mild tenderness to palpation over the left trapezius muscle. ?Nexplanon is palpable subcutaneously in the left upper extremity.  ?Lymphadenopathy:  ?   Comments: No palpable left axillary lymphadenopathy or supraclavicular adenopathy.  ?Skin: ?   General: Skin is warm.  ?Neurological:  ?   General: No focal deficit present.  ?   Mental Status: She is alert.  ?   Sensory: No sensory deficit.  ?   Motor: No weakness.  ?   Coordination: Coordination normal.  ?   Comments: Awake and alert, answers all questions appropriately.  Speech is not slurred.  ?Psychiatric:     ?   Mood and Affect: Mood normal.     ?   Behavior: Behavior normal.  ? ? ?  ED Results / Procedures / Treatments   ?Labs ?(all labs ordered are listed, but only abnormal results are displayed) ?Labs Reviewed  ?CBC WITH DIFFERENTIAL/PLATELET - Abnormal; Notable for the following components:  ?    Result Value  ? Hemoglobin 11.0 (*)   ? HCT 35.6 (*)   ? MCH 25.8 (*)   ? All other components within normal limits  ?BASIC METABOLIC PANEL  ?TROPONIN I (HIGH SENSITIVITY)  ? ? ?EKG ?None ? ?Radiology ?DG Chest 2 View ? ?Result Date: 07/19/2021 ?CLINICAL DATA:  Chest pain, left-sided rib pain, left axillary pain and left scapular pain. No injury. EXAM: CHEST - 2 VIEW COMPARISON:  11/02/2020 and CT chest 11/02/2020. FINDINGS: Trachea is midline. Heart size normal. Lungs are clear. No pleural fluid. Osseous structures appear grossly intact. IMPRESSION: No acute findings. Electronically Signed   By: Lorin Picket M.D.   On: 07/19/2021 15:57  ? ?DG Shoulder  Left ? ?Result Date: 07/19/2021 ?CLINICAL DATA:  Left arm pain. EXAM: LEFT SHOULDER - 2+ VIEW COMPARISON:  Chest radiograph 07/19/2021 FINDINGS: Left shoulder is located without acute fracture. Visualized left ribs are intact. No gross abnormality to the left AC joint. IMPRESSION: Negative. Electronically Signed   By: Markus Daft M.D.   On: 07/19/2021 15:56   ? ?Procedures ?Procedures  ? ? ?Medications Ordered in ED ?Medications - No data to display ? ?ED Course/ Medical Decision Making/ A&P ?Clinical Course as of 07/19/21 1814  ?Wed Jul 19, 2021  ?1811 Hemoglobin(!): 11.0 ?Mild anemia [EH]  ?1811 Troponin I (High Sensitivity) ?Troponin is not elevated.  Delta is not indicated his symptoms have been constant for more than 2 weeks. [EH]  ?9563 Basic metabolic panel ?No clinically significant derangements [EH]  ?[place] 2 View ?No acute clinically significant abnormalities [EH]  ?1812 DG Shoulder Left ?No acute clinically significant abnormalities [EH]  ?  ?Clinical Course User Index ?[EH] Lorin Glass, PA-C  ? ?                        ?Medical Decision Making ?Patient is a 51 year old woman who presents today for evaluation of 2 weeks of pain in her left axilla and shoulder.  This is atraumatic. ?On my exam her pain is easily recreated and exacerbated with abduction of the arm over 90 degrees/above her head.  She is otherwise neurovascularly intact distally. ?She does not have palpable lymphadenopathy in the left axilla, the left arm is not swollen. ?EKG without acute ischemic changes.  Troponin is not elevated. ? ?Doubt serious intrathoracic, cardiopulmonary or other life-threatening cause of her pain and symptoms.  Clinically I suspect musculoskeletal cause of her pain such as rotator cuff inflammation. ?Recommended OTC medications as needed, PCP follow-up regarding her mild anemia, and orthopedics follow-up for her shoulder. ? ?Amount and/or Complexity of Data Reviewed ?External Data Reviewed: notes. ?    Details: Previous ED visit when she had COVID ?Labs: ordered. Decision-making details documented in ED Course. ?Radiology: ordered and independent interpretation performed. ?ECG/medicine tests: ordered. ? ?Risk ?OTC drugs. ?Decision regarding hospitalization. ? ? ?Return precautions were discussed with patient who states their understanding.  At the time of discharge patient denied any unaddressed complaints or concerns.  Patient is agreeable for discharge home. ? ?Note: Portions of this report may have been transcribed using voice recognition software. Every effort was made to ensure accuracy; however, inadvertent computerized transcription errors may be present ? ? ? ? ? ? ? ? ?  Final Clinical Impression(s) / ED Diagnoses ?Final diagnoses:  ?Acute pain of left shoulder  ?Left axillary pain  ? ? ?Rx / DC Orders ?ED Discharge Orders   ? ? None  ? ?  ? ? ?  ?Lorin Glass, [place] ?07/19/21 1815 ? ?  ?Teressa Lower, MD ?07/20/21 0030 ? ?

## 2021-07-19 NOTE — ED Triage Notes (Signed)
Pt presents with left arm pain and in her axilla.  Pt states pain has been present x 2 weeks.  Worse when reaching down.  ?

## 2021-07-19 NOTE — Discharge Instructions (Addendum)
Please take Ibuprofen (Advil, motrin) and Tylenol (acetaminophen) to relieve your pain.   ? ?You may take up to 600 MG (3 pills) of normal strength ibuprofen every 8 hours as needed.   ?You make take tylenol, up to 1,000 mg (two extra strength pills) every 8 hours as needed.  ? ?It is safe to take ibuprofen and tylenol at the same time as they work differently.  ? Do not take more than 3,000 mg tylenol in a 24 hour period (not more than one dose every 8 hours.  Please check all medication labels as many medications such as pain and cold medications may contain tylenol.  Do not drink alcohol while taking these medications.  Do not take other NSAID'S while taking ibuprofen (such as aleve or naproxen).  Please take ibuprofen with food to decrease stomach upset. ? ? ?Please follow up with orthopedics.  If you develop any new or concerning symptoms especially if it includes swelling in the arm, numbness in the arm, weakness in the arm, shortness of breath, leg swelling or fevers, Please seek additional medical care and evaluation.  ?

## 2022-01-17 DIAGNOSIS — Z01419 Encounter for gynecological examination (general) (routine) without abnormal findings: Secondary | ICD-10-CM | POA: Diagnosis not present

## 2022-09-19 ENCOUNTER — Other Ambulatory Visit: Payer: Self-pay

## 2022-09-19 ENCOUNTER — Emergency Department (HOSPITAL_COMMUNITY)
Admission: EM | Admit: 2022-09-19 | Discharge: 2022-09-19 | Disposition: A | Discharge status: Home / Self Care | Payer: PRIVATE HEALTH INSURANCE | Attending: Emergency Medicine | Admitting: Emergency Medicine

## 2022-09-19 ENCOUNTER — Encounter (HOSPITAL_COMMUNITY): Payer: Self-pay

## 2022-09-19 DIAGNOSIS — R42 Dizziness and giddiness: Secondary | ICD-10-CM | POA: Diagnosis present

## 2022-09-19 DIAGNOSIS — Z79899 Other long term (current) drug therapy: Secondary | ICD-10-CM | POA: Diagnosis not present

## 2022-09-19 DIAGNOSIS — I10 Essential (primary) hypertension: Secondary | ICD-10-CM | POA: Diagnosis not present

## 2022-09-19 LAB — BASIC METABOLIC PANEL
Anion gap: 7 (ref 5–15)
BUN: 13 mg/dL (ref 6–20)
CO2: 24 mmol/L (ref 22–32)
Calcium: 9.2 mg/dL (ref 8.9–10.3)
Chloride: 106 mmol/L (ref 98–111)
Creatinine, Ser: 0.76 mg/dL (ref 0.44–1.00)
GFR, Estimated: >60 mL/min (ref >60)
Glucose, Bld: 91 mg/dL (ref 70–99)
Potassium: 3.9 mmol/L (ref 3.5–5.1)
Sodium: 137 mmol/L (ref 135–145)

## 2022-09-19 LAB — CBC WITH DIFFERENTIAL/PLATELET
Abs Immature Granulocytes: 0.01 10*3/uL (ref 0.00–0.07)
Basophils Absolute: 0 10*3/uL (ref 0.0–0.1)
Basophils Relative: 0 %
Eosinophils Absolute: 0.1 10*3/uL (ref 0.0–0.5)
Eosinophils Relative: 1 %
HCT: 36 % (ref 36.0–46.0)
Hemoglobin: 11.3 g/dL — ABNORMAL LOW (ref 12.0–15.0)
Immature Granulocytes: 0 %
Lymphocytes Relative: 17 %
Lymphs Abs: 1 10*3/uL (ref 0.7–4.0)
MCH: 25.1 pg — ABNORMAL LOW (ref 26.0–34.0)
MCHC: 31.4 g/dL (ref 30.0–36.0)
MCV: 80 fL (ref 80.0–100.0)
Monocytes Absolute: 0.3 10*3/uL (ref 0.1–1.0)
Monocytes Relative: 5 %
Neutro Abs: 4.5 10*3/uL (ref 1.7–7.7)
Neutrophils Relative %: 77 %
Platelets: 290 10*3/uL (ref 150–400)
RBC: 4.5 MIL/uL (ref 3.87–5.11)
RDW: 15.4 % (ref 11.5–15.5)
WBC: 5.8 10*3/uL (ref 4.0–10.5)
nRBC: 0 % (ref 0.0–0.2)

## 2022-09-19 MED ORDER — AMLODIPINE BESYLATE 2.5 MG PO TABS: 2.5000 mg | ORAL_TABLET | Freq: Every day | ORAL | 1 refills | Status: DC

## 2022-09-19 NOTE — Discharge Instructions (Signed)
Seen in the emergency department for your dizziness.  Your EKG showed that you are in a normal heart rhythm and your workup showed no abnormalities of your electrolytes and no significant change to your anemia.  Your blood pressure was high here and we have given you a prescription for a blood pressure medication that you can start taking daily.  You should also increase your fluid intake and make sure that you are staying well-hydrated.  You can get a blood pressure cuff from the drugstore and check your blood pressures at home.  You should check it once a day around the same time every day when you have been resting for at least 15 minutes and keep a log of your pressures.  You can bring this to your primary doctor's appointment to see if they need to make changes to your medication.  You should return to the emergency department if you have dizziness that is constant and does not go away, you pass out, you have severe chest pain or if you have any other new or concerning symptoms.

## 2022-09-19 NOTE — ED Triage Notes (Signed)
Pt c/o dizzinessx2wks. Pt denies any other sx.

## 2022-09-19 NOTE — ED Provider Notes (Signed)
[place] EMERGENCY DEPARTMENT AT [facility] Provider Note   CSN: [id number] Arrival date & time: 09/19/22  1432     History  Chief Complaint  Patient presents with   Dizziness    Brianna Serrano is a 52 y.o. female.  Patient is a 52 year old female with no known past medical history presenting to the emergency department with dizziness.  Patient states that she has had dizziness for the last 2 weeks that has been coming and going.  She states that since it has not resolved she decided to come to the emergency department to be evaluated today however does not think that it is occurring more frequently.  She states that it usually occurs first thing in the morning or in the evening.  She states that she feels lightheaded like she might pass out with some mild feelings of being off balance.  She states that once or twice over the last 2 weeks she has had a headache in the night but states that this does not occur very frequently and does not always happen with the dizziness.  She denies any associated numbness or weakness, chest pain or shortness of breath.  She denies any recent nausea, vomiting or diarrhea or medication changes.  The history is provided by the patient.  Dizziness      Home Medications Prior to Admission medications   Medication Sig Start Date End Date Taking? Authorizing Provider  amLODipine (NORVASC) 2.5 MG tablet Take 1 tablet (2.5 mg total) by mouth daily. 09/19/22  Yes Elayne Snare K, DO      Allergies    Patient has no known allergies.    Review of Systems   Review of Systems  Neurological:  Positive for dizziness.    Physical Exam Updated Vital Signs BP (!) 173/91   Pulse 74   Temp 98.3 F (36.8 C) (Oral)   Resp 13   Ht 5\' 5"  (1.651 m)   Wt 83 kg   SpO2 100%   BMI 30.45 kg/m  Physical Exam Vitals and nursing note reviewed.  Constitutional:      General: She is not in acute distress.    Appearance: Normal appearance.  HENT:      Head: Normocephalic and atraumatic.     Nose: Nose normal.     Mouth/Throat:     Mouth: Mucous membranes are moist.     Pharynx: Oropharynx is clear.  Eyes:     Extraocular Movements: Extraocular movements intact.     Conjunctiva/sclera: Conjunctivae normal.     Pupils: Pupils are equal, round, and reactive to light.     Comments: No nystagmus  Cardiovascular:     Rate and Rhythm: Normal rate and regular rhythm.     Heart sounds: Normal heart sounds.  Pulmonary:     Effort: Pulmonary effort is normal.     Breath sounds: Normal breath sounds.  Abdominal:     General: Abdomen is flat.     Palpations: Abdomen is soft.     Tenderness: There is no abdominal tenderness.  Musculoskeletal:        General: Normal range of motion.     Cervical back: Normal range of motion and neck supple.  Skin:    General: Skin is warm and dry.  Neurological:     General: No focal deficit present.     Mental Status: She is alert and oriented to person, place, and time.     Cranial Nerves: No cranial nerve deficit.  Sensory: No sensory deficit.     Motor: No weakness.     Coordination: Coordination normal.  Psychiatric:        Mood and Affect: Mood normal.        Behavior: Behavior normal.     ED Results / Procedures / Treatments   Labs (all labs ordered are listed, but only abnormal results are displayed) Labs Reviewed  CBC WITH DIFFERENTIAL/PLATELET - Abnormal; Notable for the following components:      Result Value   Hemoglobin 11.3 (*)    MCH 25.1 (*)    All other components within normal limits  BASIC METABOLIC PANEL    EKG EKG Interpretation Date/Time:  Wednesday September 19 2022 18:59:16 EDT Ventricular Rate:  76 PR Interval:  143 QRS Duration:  81 QT Interval:  378 QTC Calculation: 425 R Axis:   29  Text Interpretation: Sinus rhythm Consider RVH or posterior infarct Since last tracing of earlier today No significant change was found Confirmed by Elayne Snare ([id number]) on  09/19/2022 7:02:14 PM  Radiology No results found.  Procedures Procedures    Medications Ordered in ED Medications - No data to display  ED Course/ Medical Decision Making/ A&P Clinical Course as of 09/19/22 1956  Wed Sep 19, 2022  1930 Orthostatic vitals negative though patient was symptomatic. [VK]  1954 On reassessment, patient reports she asymptomatic after sitting back down. She is hypertensive here and has been hypertensive on multiple visits and will be started on BP meds. Recommended increased fluids and primary care follow up. [VK]    Clinical Course User Index [VK] Rexford Maus, DO                             Medical Decision Making This patient presents to the ED with chief complaint(s) of dizziness with no pertinent past medical history which further complicates the presenting complaint. The complaint involves an extensive differential diagnosis and also carries with it a high risk of complications and morbidity.    The differential diagnosis includes arrhythmia, anemia, dehydration, electrolyte abnormality, orthostatic hypotension, she has no neurologic deficits and no room spinning sensation making vertigo unlikely  Additional history obtained: Additional history obtained from N/A Records reviewed N/A  ED Course and Reassessment: Since arrival she is hemodynamically stable in no acute distress.  She was initially evaluated in triage and had EKG and labs performed.  EKG shows normal sinus rhythm without acute ischemic changes.  Labs showed mild anemia at her baseline and otherwise no acute abnormality.  She will have orthostatic vitals performed.  She is asymptomatic at this time without deficits and likely will be stable for discharge with outpatient follow-up.  She does not currently have a PCP.  Independent labs interpretation:  The following labs were independently interpreted: Within normal range/at baseline  Independent visualization of  imaging: -N/A  Consultation: - Consulted or discussed management/test interpretation w/ external professional: N/A  Consideration for admission or further workup: Patient has no emergent conditions requiring admission or further work-up at this time and is stable for discharge home with primary care follow-up  Social Determinants of health: N/A    Amount and/or Complexity of Data Reviewed Labs: ordered.  Risk Prescription drug management.          Final Clinical Impression(s) / ED Diagnoses Final diagnoses:  Dizziness  Hypertension, unspecified type    Rx / DC Orders ED Discharge Orders  Ordered    amLODipine (NORVASC) 2.5 MG tablet  Daily        09/19/22 1955              Rexford Maus, [place] 09/19/22 1956

## 2022-09-26 ENCOUNTER — Ambulatory Visit (INDEPENDENT_AMBULATORY_CARE_PROVIDER_SITE_OTHER): Payer: PRIVATE HEALTH INSURANCE | Admitting: Nurse Practitioner

## 2022-09-26 ENCOUNTER — Encounter: Payer: Self-pay | Admitting: Nurse Practitioner

## 2022-09-26 VITALS — BP 128/64 | HR 72 | Temp 97.4°F | Ht 65.0 in | Wt 178.0 lb

## 2022-09-26 DIAGNOSIS — I10 Essential (primary) hypertension: Secondary | ICD-10-CM | POA: Diagnosis not present

## 2022-09-26 DIAGNOSIS — D508 Other iron deficiency anemias: Secondary | ICD-10-CM | POA: Insufficient documentation

## 2022-09-26 DIAGNOSIS — Z1211 Encounter for screening for malignant neoplasm of colon: Secondary | ICD-10-CM | POA: Diagnosis not present

## 2022-09-26 DIAGNOSIS — Z23 Encounter for immunization: Secondary | ICD-10-CM | POA: Insufficient documentation

## 2022-09-26 DIAGNOSIS — D649 Anemia, unspecified: Secondary | ICD-10-CM

## 2022-09-26 DIAGNOSIS — Z1231 Encounter for screening mammogram for malignant neoplasm of breast: Secondary | ICD-10-CM | POA: Insufficient documentation

## 2022-09-26 DIAGNOSIS — Z09 Encounter for follow-up examination after completed treatment for conditions other than malignant neoplasm: Secondary | ICD-10-CM

## 2022-09-26 MED ORDER — AMLODIPINE BESYLATE 2.5 MG PO TABS
2.5000 mg | ORAL_TABLET | Freq: Every day | ORAL | 1 refills | Status: DC
Start: 2022-09-26 — End: 2022-12-26

## 2022-09-26 NOTE — Progress Notes (Signed)
New Patient Office Visit  Subjective:  Patient ID: Brianna Serrano, female    DOB: 07-09-1970  Age: 52 y.o. MRN: [id number]  CC:  Chief Complaint  Patient presents with   Hospitalization Follow-up    Dizziness per pt she feels better.    HPI Brianna Serrano is a 52 y.o. female  has a past medical history of Hypercholesteremia, Hypertension, and Prediabetes.  Patient presents to establish care for her chronic medical conditions and for hospital follow-up.. previous PCP was at [facility] internal medicine in [facility], patient was last seen in 2015.  Hypertension patient was at emergency department on 06/27/2022 for complaints of dizziness.  EKG showed normal sinus rhythm without acute ischemic changes.  She was started on amlodipine 2.5 mg daily for hypertension.  Patient stated that her blood pressure has been well-controlled at home.  No more dizziness she denies chest pain, shortness of breath, edema.   Due for mammogram.  Last mammogram was in 2017, had a left breast mass favored to represent a small fibroadenoma or possibly a small complicated cyst, there was a recommendation for diagnostic mammogram and ultrasound in 6 months.  Referral sent today.   Due for Tdap vaccine and shingles vaccine.  Both vaccines were administered in the office today.  Plans for Pap smear at next visit.  Due for colon cancer screening referral sent to GI for colonoscopy.       Past Medical History:  Diagnosis Date   Hypercholesteremia    Hypertension    Prediabetes     Past Surgical History:  Procedure Laterality Date   CESAREAN SECTION      Family History  Problem Relation Age of Onset   Hypertension Father    Stroke Neg Hx    Diabetes Neg Hx    Colon cancer Neg Hx    Breast cancer Neg Hx     Social History   Socioeconomic History   Marital status: Married    Spouse name: Not on file   Number of children: 6   Years of education: Not on file   Highest education level: Not on file   Occupational History   Not on file  Tobacco Use   Smoking status: Never   Smokeless tobacco: Never  Substance and Sexual Activity   Alcohol use: No   Drug use: No   Sexual activity: Yes  Other Topics Concern   Not on file  Social History Narrative   Lives with her husband    Social Determinants of Health   Financial Resource Strain: Not on file  Food Insecurity: Not on file  Transportation Needs: Not on file  Physical Activity: Not on file  Stress: Not on file  Social Connections: Not on file  Intimate Partner Violence: Not on file    ROS Review of Systems  Constitutional:  Negative for activity change, appetite change, chills, fatigue and fever.  HENT:  Negative for congestion, dental problem, ear discharge, ear pain, hearing loss, rhinorrhea, sinus pressure, sinus pain, sneezing and sore throat.   Eyes:  Negative for pain, discharge, redness and itching.  Respiratory:  Negative for cough, chest tightness, shortness of breath and wheezing.   Cardiovascular:  Negative for chest pain, palpitations and leg swelling.  Gastrointestinal:  Negative for abdominal distention, abdominal pain, anal bleeding, blood in stool, constipation, diarrhea, nausea, rectal pain and vomiting.  Endocrine: Negative for cold intolerance, heat intolerance, polydipsia, polyphagia and polyuria.  Genitourinary:  Negative for difficulty urinating, dysuria, flank pain, frequency,  hematuria, menstrual problem, pelvic pain and vaginal bleeding.  Musculoskeletal:  Negative for arthralgias, back pain, gait problem, joint swelling and myalgias.  Skin:  Negative for color change, pallor, rash and wound.  Allergic/Immunologic: Negative for environmental allergies, food allergies and immunocompromised state.  Neurological:  Negative for dizziness, tremors, facial asymmetry, weakness and headaches.  Hematological:  Negative for adenopathy. Does not bruise/bleed easily.  Psychiatric/Behavioral:  Negative for  agitation, behavioral problems, confusion, decreased concentration, hallucinations, self-injury and suicidal ideas.     Objective:   Today's Vitals: BP 128/64   Pulse 72   Temp (!) 97.4 F (36.3 C)   Ht 5\' 5"  (1.651 m)   Wt 178 lb (80.7 kg)   SpO2 100%   BMI 29.62 kg/m   Physical Exam Vitals and nursing note reviewed.  Constitutional:      General: She is not in acute distress.    Appearance: Normal appearance. She is not ill-appearing, toxic-appearing or diaphoretic.  HENT:     Mouth/Throat:     Mouth: Mucous membranes are moist.     Pharynx: Oropharynx is clear. No oropharyngeal exudate or posterior oropharyngeal erythema.  Eyes:     General: No scleral icterus.       Right eye: No discharge.        Left eye: No discharge.     Extraocular Movements: Extraocular movements intact.     Conjunctiva/sclera: Conjunctivae normal.  Cardiovascular:     Rate and Rhythm: Normal rate and regular rhythm.     Pulses: Normal pulses.     Heart sounds: Normal heart sounds. No murmur heard.    No friction rub. No gallop.  Pulmonary:     Effort: Pulmonary effort is normal. No respiratory distress.     Breath sounds: Normal breath sounds. No stridor. No wheezing, rhonchi or rales.  Chest:     Chest wall: No tenderness.  Abdominal:     General: There is no distension.     Palpations: Abdomen is soft.     Tenderness: There is no abdominal tenderness. There is no right CVA tenderness, left CVA tenderness or guarding.  Musculoskeletal:        General: No swelling, tenderness, deformity or signs of injury.     Right lower leg: No edema.     Left lower leg: No edema.  Skin:    General: Skin is warm and dry.     Capillary Refill: Capillary refill takes less than 2 seconds.     Coloration: Skin is not jaundiced or pale.     Findings: No bruising, erythema or lesion.  Neurological:     Mental Status: She is alert and oriented to person, place, and time.     Motor: No weakness.      Coordination: Coordination normal.     Gait: Gait normal.  Psychiatric:        Mood and Affect: Mood normal.        Behavior: Behavior normal.        Thought Content: Thought content normal.        Judgment: Judgment normal.     Assessment & Plan:   Problem List Items Addressed This Visit       Cardiovascular and Mediastinum   Primary hypertension    BP Readings from Last 3 Encounters:  09/26/22 128/64  09/19/22 (!) 171/86  07/19/21 (!) 156/81   HTN Controlled on amlodipine 2.5 mg daily Continue current medications. No changes in management. Discussed DASH diet and dietary  sodium restrictions Continue to increase dietary efforts and exercise.         Relevant Medications   amLODipine (NORVASC) 2.5 MG tablet     Other   Screening mammogram for breast cancer   Relevant Orders   MM Digital Screening   Screening for colon cancer - Primary   Relevant Orders   Ambulatory referral to Gastroenterology   Need for shingles vaccine    Patient educated on CDC recommendation for the shingles  vaccine. Verbal consent was obtained from the patient, vaccine administered by nurse, no sign of adverse reactions noted at this time. Patient education on arm soreness and use of tylenol for this patient  was discussed. Patient educated on the signs and symptoms of adverse effect and advise to contact the office if they occur.      Need for diphtheria-tetanus-pertussis (Tdap) vaccine    Patient educated on CDC recommendation for the TDAP vaccine. Verbal consent was obtained from the patient, vaccine administered by nurse, no sign of adverse reactions noted at this time. Patient education on arm soreness and use of tylenol for this patient  was discussed. Patient educated on the signs and symptoms of adverse effect and advise to contact the office if they occur.       Relevant Orders   Tdap vaccine greater than or equal to 7yo IM (Completed)   Anemia    Lab Results  Component Value Date    WBC 5.8 09/19/2022   HGB 11.3 (L) 09/19/2022   HCT 36.0 09/19/2022   MCV 80.0 09/19/2022   PLT 290 09/19/2022  Patient denies fatigue, vaginal bleeding Will check iron panel      Relevant Orders   Iron, TIBC and Ferritin Panel   Encounter for examination following treatment at hospital    ER visit summary labs and recommendations reviewed by me She is doing well on amlodipine 2.5 mg daily Currently denies dizziness       Outpatient Encounter Medications as of 09/26/2022  Medication Sig   [DISCONTINUED] amLODipine (NORVASC) 2.5 MG tablet Take 1 tablet (2.5 mg total) by mouth daily.   amLODipine (NORVASC) 2.5 MG tablet Take 1 tablet (2.5 mg total) by mouth daily.   No facility-administered encounter medications on file as of 09/26/2022.    Follow-up: Return in about 3 months (around 12/27/2022) for CPE.   Donell Beers, FNP

## 2022-09-26 NOTE — Assessment & Plan Note (Signed)
ER visit summary labs and recommendations reviewed by me She is doing well on amlodipine 2.5 mg daily Currently denies dizziness

## 2022-09-26 NOTE — Assessment & Plan Note (Signed)
Lab Results  Component Value Date   WBC 5.8 09/19/2022   HGB 11.3 (L) 09/19/2022   HCT 36.0 09/19/2022   MCV 80.0 09/19/2022   PLT 290 09/19/2022  Patient denies fatigue, vaginal bleeding Will check iron panel

## 2022-09-26 NOTE — Assessment & Plan Note (Signed)
Patient educated on CDC recommendation for the TDAP vaccine. Verbal consent was obtained from the patient, vaccine administered by nurse, no sign of adverse reactions noted at this time. Patient education on arm soreness and use of tylenol for this patient  was discussed. Patient educated on the signs and symptoms of adverse effect and advise to contact the office if they occur.  

## 2022-09-26 NOTE — Assessment & Plan Note (Signed)
Patient educated on CDC recommendation for theshingles vaccine. Verbal consent was obtained from the patient, vaccine administered by nurse, no sign of adverse reactions noted at this time. Patient education on arm soreness and use of tylenol  for this patient  was discussed. Patient educated on the signs and symptoms of adverse effect and advise to contact the office if they occur.  

## 2022-09-26 NOTE — Assessment & Plan Note (Signed)
BP Readings from Last 3 Encounters:  09/26/22 128/64  09/19/22 (!) 171/86  07/19/21 (!) 156/81   HTN Controlled on amlodipine 2.5 mg daily Continue current medications. No changes in management. Discussed DASH diet and dietary sodium restrictions Continue to increase dietary efforts and exercise.

## 2022-09-26 NOTE — Patient Instructions (Addendum)
1. Screening for colon cancer  - Cologuard  2. Screening mammogram for breast cancer  - MM Digital Screening  3. Need for diphtheria-tetanus-pertussis (Tdap) vaccine   4. Need for shingles vaccine   5. Primary hypertension  - amLODipine (NORVASC) 2.5 MG tablet; Take 1 tablet (2.5 mg total) by mouth daily.  Dispense: 90 tablet; Refill: 1  6. Anemia, unspecified type  - Iron, TIBC and Ferritin Panel   Please come fasting to your next appointment  It is important that you exercise regularly at least 30 minutes 5 times a week as tolerated  Think about what you will eat, plan ahead. Choose " clean, green, fresh or frozen" over canned, processed or packaged foods which are more sugary, salty and fatty. 70 to 75% of food eaten should be vegetables and fruit. Three meals at set times with snacks allowed between meals, but they must be fruit or vegetables. Aim to eat over a 12 hour period , example 7 am to 7 pm, and STOP after  your last meal of the day. Drink water,generally about 64 ounces per day, no other drink is as healthy. Fruit juice is best enjoyed in a healthy way, by EATING the fruit.  Thanks for choosing [facility] we consider it a privelige to serve you.

## 2022-09-26 NOTE — Progress Notes (Signed)
 See notes above

## 2022-09-27 LAB — IRON,TIBC AND FERRITIN PANEL
Ferritin: 61 ng/mL (ref 15–150)
Iron Saturation: 13 % — ABNORMAL LOW (ref 15–55)
Iron: 41 ug/dL (ref 27–159)
Total Iron Binding Capacity: 324 ug/dL (ref 250–450)
UIBC: 283 ug/dL (ref 131–425)

## 2022-10-31 ENCOUNTER — Ambulatory Visit
Admission: RE | Admit: 2022-10-31 | Discharge: 2022-10-31 | Disposition: A | Discharge status: Home / Self Care | Payer: PRIVATE HEALTH INSURANCE | Source: Ambulatory Visit | Attending: Nurse Practitioner

## 2022-12-05 ENCOUNTER — Encounter: Payer: PRIVATE HEALTH INSURANCE | Admitting: Gastroenterology

## 2022-12-14 ENCOUNTER — Other Ambulatory Visit: Payer: Self-pay | Admitting: Nurse Practitioner

## 2022-12-14 DIAGNOSIS — Z1212 Encounter for screening for malignant neoplasm of rectum: Secondary | ICD-10-CM

## 2022-12-14 DIAGNOSIS — Z1211 Encounter for screening for malignant neoplasm of colon: Secondary | ICD-10-CM

## 2022-12-26 ENCOUNTER — Encounter: Payer: Self-pay | Admitting: Nurse Practitioner

## 2022-12-26 ENCOUNTER — Ambulatory Visit (INDEPENDENT_AMBULATORY_CARE_PROVIDER_SITE_OTHER): Payer: No Typology Code available for payment source | Admitting: Nurse Practitioner

## 2022-12-26 VITALS — BP 132/60 | HR 69 | Temp 97.0°F | Ht 63.0 in | Wt 179.6 lb

## 2022-12-26 DIAGNOSIS — D508 Other iron deficiency anemias: Secondary | ICD-10-CM

## 2022-12-26 DIAGNOSIS — I10 Essential (primary) hypertension: Secondary | ICD-10-CM | POA: Diagnosis not present

## 2022-12-26 DIAGNOSIS — Z13220 Encounter for screening for lipoid disorders: Secondary | ICD-10-CM

## 2022-12-26 DIAGNOSIS — Z0000 Encounter for general adult medical examination without abnormal findings: Secondary | ICD-10-CM | POA: Insufficient documentation

## 2022-12-26 DIAGNOSIS — Z1211 Encounter for screening for malignant neoplasm of colon: Secondary | ICD-10-CM

## 2022-12-26 DIAGNOSIS — H543 Unqualified visual loss, both eyes: Secondary | ICD-10-CM | POA: Insufficient documentation

## 2022-12-26 DIAGNOSIS — K5903 Drug induced constipation: Secondary | ICD-10-CM | POA: Insufficient documentation

## 2022-12-26 MED ORDER — AMLODIPINE BESYLATE 2.5 MG PO TABS
2.5000 mg | ORAL_TABLET | Freq: Every day | ORAL | 1 refills | Status: DC
Start: 2022-12-26 — End: 2024-01-27

## 2022-12-26 NOTE — Assessment & Plan Note (Signed)
Wears reading glasses Has not seen an eye doctor in years Will refer patient to ophthalmology

## 2022-12-26 NOTE — Assessment & Plan Note (Addendum)
Lab Results  Component Value Date   IRON 41 09/26/2022   TIBC 324 09/26/2022   FERRITIN 61 09/26/2022    Lab Results  Component Value Date   WBC 5.8 09/19/2022   HGB 11.3 (L) 09/19/2022   HCT 36.0 09/19/2022   MCV 80.0 09/19/2022   PLT 290 09/19/2022  On OTC ferrous sulfate Will recheck CBC

## 2022-12-26 NOTE — Assessment & Plan Note (Signed)
Due to taking iron supplements  patient encouraged to increase intake of fiber, engage in regular moderate exercise at least 150 minutes weekly, drink at least 64 ounces of water daily to maintain hydration.  May take OTC stool softener or MiraLAX as needed

## 2022-12-26 NOTE — Assessment & Plan Note (Deleted)
Patient educated on CDC recommendation for shingles the vaccine. Verbal consent was obtained from the patient, vaccine administered by nurse, no sign of adverse reactions noted at this time. Patient education on arm soreness and use of tylenol  for this patient  was discussed. Patient educated on the signs and symptoms of adverse effect and advise to contact the office if they occur. Vaccine information sheet given to patient.

## 2022-12-26 NOTE — Patient Instructions (Addendum)
Nurse please get records of PAP smear from health department in [place].   For constipation it is important that you have an adequate intake of fruit and vegetables daily, at least 3 servings of each, as well as water intake of at least 48 ounces daily and regular exercise.  OTC stool softeners are helpful for daily use, up to 4 daily (eg. Colace)  Fiber intake daily is needed, in the form of Bran or Shredded Wheat   1. Primary hypertension  - amLODipine (NORVASC) 2.5 MG tablet; Take 1 tablet (2.5 mg total) by mouth daily.  Dispense: 90 tablet; Refill: 1  2. Need for shingles vaccine   3. Screening for colon cancer  - Cologuard  4. Anemia, unspecified type  - CBC; Future  5. Screening for lipid disorders  - Lipid panel; Future    It is important that you exercise regularly at least 30 minutes 5 times a week as tolerated  Think about what you will eat, plan ahead. Choose " clean, green, fresh or frozen" over canned, processed or packaged foods which are more sugary, salty and fatty. 70 to 75% of food eaten should be vegetables and fruit. Three meals at set times with snacks allowed between meals, but they must be fruit or vegetables. Aim to eat over a 12 hour period , example 7 am to 7 pm, and STOP after  your last meal of the day. Drink water,generally about 64 ounces per day, no other drink is as healthy. Fruit juice is best enjoyed in a healthy way, by EATING the fruit.  Thanks for choosing [facility] we consider it a privelige to serve you.

## 2022-12-26 NOTE — Assessment & Plan Note (Addendum)
Home BP readings 130/70 BP Readings from Last 3 Encounters:  12/26/22 132/60  09/26/22 128/64  09/19/22 (!) 171/86   HTN Controlled .  On amlodipine 2.5 mg daily Continue current medications. No changes in management. Discussed DASH diet and dietary sodium restrictions Continue to increase dietary efforts and exercise.  Follow-up in 6 months

## 2022-12-26 NOTE — Progress Notes (Addendum)
Complete physical exam  Patient: Brianna Serrano   DOB: 1970/07/10   52 y.o. Female  MRN: [id number]  Subjective:    Chief Complaint  Patient presents with   Annual Exam    Not fasting     Brianna Serrano is a 52 y.o. female  has a past medical history of Hypercholesteremia, Hypertension, and Prediabetes.  who presents today for a complete physical exam. She reports consuming a general diet. The patient does not participate in regular exercise at present. She generally feels well. She reports sleeping well. She does not have additional problems to discuss today.   GI does not take her current insurance , she would like to proceed with Cologuard test to screen for colon cancer . Patient declined flu vaccine, needs second dose of shingles vaccine   Up-to-date with cervical cancer screening we will get reports from the health department in [place].    Most recent fall risk assessment:    09/26/2022    1:46 PM  Fall Risk   Falls in the past year? 0  Number falls in past yr: 0  Injury with Fall? 0  Risk for fall due to : No Fall Risks  Follow up Falls evaluation completed     Most recent depression screenings:    12/26/2022    1:56 PM 09/26/2022    1:46 PM  PHQ 2/9 Scores  PHQ - 2 Score 0 0        Patient Care Team: Donell Beers, FNP as PCP - General (Nurse Practitioner)   Outpatient Medications Prior to Visit  Medication Sig   Ferrous Sulfate (IRON PO) Take by mouth.   [DISCONTINUED] amLODipine (NORVASC) 2.5 MG tablet Take 1 tablet (2.5 mg total) by mouth daily.   No facility-administered medications prior to visit.    Review of Systems  Constitutional:  Negative for activity change, appetite change, chills, fatigue and fever.  HENT:  Negative for congestion, dental problem, ear discharge, ear pain, hearing loss, rhinorrhea, sinus pressure, sinus pain, sneezing and sore throat.   Eyes:  Positive for visual disturbance. Negative for pain, discharge, redness and  itching.  Respiratory:  Negative for cough, chest tightness, shortness of breath and wheezing.   Cardiovascular:  Negative for chest pain, palpitations and leg swelling.  Gastrointestinal:  Positive for constipation. Negative for abdominal distention, abdominal pain, anal bleeding, blood in stool, diarrhea, nausea, rectal pain and vomiting.  Endocrine: Negative for cold intolerance, heat intolerance, polydipsia, polyphagia and polyuria.  Genitourinary:  Negative for difficulty urinating, dysuria, flank pain, frequency, hematuria, menstrual problem, pelvic pain and vaginal bleeding.  Musculoskeletal:  Negative for arthralgias, back pain, gait problem, joint swelling and myalgias.  Skin:  Negative for color change, pallor, rash and wound.  Allergic/Immunologic: Negative for environmental allergies, food allergies and immunocompromised state.  Neurological:  Negative for dizziness, tremors, facial asymmetry, weakness and headaches.  Hematological:  Negative for adenopathy. Does not bruise/bleed easily.  Psychiatric/Behavioral:  Negative for agitation, behavioral problems, confusion, decreased concentration, hallucinations, self-injury and suicidal ideas.        Objective:     BP 132/60   Pulse 69   Temp (!) 97 F (36.1 C)   Ht 5\' 3"  (1.6 m)   Wt 179 lb 9.6 oz (81.5 kg)   SpO2 100%   BMI 31.81 kg/m    Physical Exam Vitals and nursing note reviewed. Exam conducted with a chaperone present.  Constitutional:      General: She is not in acute  distress.    Appearance: Normal appearance. She is obese. She is not ill-appearing, toxic-appearing or diaphoretic.  HENT:     Right Ear: Tympanic membrane, ear canal and external ear normal. There is no impacted cerumen.     Left Ear: Tympanic membrane, ear canal and external ear normal. There is no impacted cerumen.     Nose: Nose normal. No congestion or rhinorrhea.     Mouth/Throat:     Mouth: Mucous membranes are moist.     Pharynx:  Oropharynx is clear. No oropharyngeal exudate or posterior oropharyngeal erythema.  Eyes:     General: No scleral icterus.       Right eye: No discharge.        Left eye: No discharge.     Extraocular Movements: Extraocular movements intact.     Conjunctiva/sclera: Conjunctivae normal.  Neck:     Vascular: No carotid bruit.  Cardiovascular:     Rate and Rhythm: Normal rate and regular rhythm.     Pulses: Normal pulses.     Heart sounds: Normal heart sounds. No murmur heard.    No friction rub. No gallop.  Pulmonary:     Effort: Pulmonary effort is normal. No respiratory distress.     Breath sounds: Normal breath sounds. No stridor. No wheezing, rhonchi or rales.  Chest:     Chest wall: No tenderness.  Abdominal:     General: Bowel sounds are normal. There is no distension.     Palpations: Abdomen is soft. There is no mass.     Tenderness: There is no abdominal tenderness. There is no right CVA tenderness, left CVA tenderness, guarding or rebound.     Hernia: No hernia is present.  Musculoskeletal:        General: No swelling, tenderness, deformity or signs of injury.     Cervical back: Normal range of motion and neck supple. No rigidity or tenderness.     Right lower leg: No edema.     Left lower leg: No edema.  Lymphadenopathy:     Cervical: No cervical adenopathy.  Skin:    General: Skin is warm and dry.     Capillary Refill: Capillary refill takes less than 2 seconds.     Coloration: Skin is not jaundiced or pale.     Findings: No bruising, erythema, lesion or rash.  Neurological:     Mental Status: She is alert and oriented to person, place, and time.     Cranial Nerves: No cranial nerve deficit.     Sensory: No sensory deficit.     Motor: No weakness.     Coordination: Coordination normal.     Gait: Gait normal.     Deep Tendon Reflexes: Reflexes normal.  Psychiatric:        Mood and Affect: Mood normal.        Behavior: Behavior normal.        Thought Content:  Thought content normal.        Judgment: Judgment normal.     No results found for any visits on 12/26/22.     Assessment & Plan:    Routine Health Maintenance and Physical Exam  Immunization History  Administered Date(s) Administered   PFIZER(Purple Top)SARS-COV-2 Vaccination 07/04/2019, 08/01/2019   Tdap 09/26/2022   Zoster Recombinant(Shingrix) 09/26/2022    Health Maintenance  Topic Date Due   Cervical Cancer Screening (HPV/Pap Cotest)  08/16/2015   Fecal DNA (Cologuard)  Never done   COVID-19 Vaccine (3 - 2023-24  season) 11/11/2022   Zoster Vaccines- Shingrix (2 of 2) 11/21/2022   INFLUENZA VACCINE  06/10/2023 (Originally 10/11/2022)   MAMMOGRAM  10/30/2024   DTaP/Tdap/Td (2 - Td or Tdap) 09/25/2032   HPV VACCINES  Aged Out   Hepatitis C Screening  Discontinued   HIV Screening  Discontinued    Discussed health benefits of physical activity, and encouraged her to engage in regular exercise appropriate for her age and condition.  Problem List Items Addressed This Visit       Cardiovascular and Mediastinum   Primary hypertension    Home BP readings 130/70 BP Readings from Last 3 Encounters:  12/26/22 132/60  09/26/22 128/64  09/19/22 (!) 171/86   HTN Controlled .  On amlodipine 2.5 mg daily Continue current medications. No changes in management. Discussed DASH diet and dietary sodium restrictions Continue to increase dietary efforts and exercise.  Follow-up in 6 months      Relevant Medications   amLODipine (NORVASC) 2.5 MG tablet     Digestive   Drug induced constipation    Due to taking iron supplements  patient encouraged to increase intake of fiber, engage in regular moderate exercise at least 150 minutes weekly, drink at least 64 ounces of water daily to maintain hydration.  May take OTC stool softener or MiraLAX as needed        Other   Screening for colon cancer   Relevant Orders   Cologuard   Anemia, due to inadequate iron intake    Lab  Results  Component Value Date   IRON 41 09/26/2022   TIBC 324 09/26/2022   FERRITIN 61 09/26/2022    Lab Results  Component Value Date   WBC 5.8 09/19/2022   HGB 11.3 (L) 09/19/2022   HCT 36.0 09/19/2022   MCV 80.0 09/19/2022   PLT 290 09/19/2022  On OTC ferrous sulfate Will recheck CBC       Relevant Medications   Ferrous Sulfate (IRON PO)   Other Relevant Orders   CBC   Annual physical exam - Primary    Annual exam as documented.  Counseling done include healthy lifestyle involving committing to 150 minutes of exercise per week, heart healthy diet, and attaining healthy weight. The importance of adequate sleep also discussed.  Regular use of seat belt and home safety were also discussed . Changes in health habits are decided on by patient with goals and time frames set for achieving them. Immunization and cancer screening  needs are specifically addressed at this visit.  Cologuard ordered, screening for lipid disorders.  Up-to-date with cervical cancer screening, mammogram. 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     Donell Beers, FNP

## 2022-12-26 NOTE — Assessment & Plan Note (Addendum)
Annual exam as documented.  Counseling done include healthy lifestyle involving committing to 150 minutes of exercise per week, heart healthy diet, and attaining healthy weight. The importance of adequate sleep also discussed.  Regular use of seat belt and home safety were also discussed . Changes in health habits are decided on by patient with goals and time frames set for achieving them. Immunization and cancer screening  needs are specifically addressed at this visit.  Cologuard ordered, screening for lipid disorders.  Up-to-date with cervical cancer screening, mammogram. Will get second dose of shingles vaccine when she comes for fasting  lab

## 2022-12-26 NOTE — Addendum Note (Signed)
Addended by: Donell Beers on: 12/26/2022 04:09 PM   Modules accepted: Orders

## 2023-02-08 LAB — COLOGUARD: COLOGUARD: NEGATIVE

## 2023-06-26 ENCOUNTER — Ambulatory Visit (INDEPENDENT_AMBULATORY_CARE_PROVIDER_SITE_OTHER): Payer: Self-pay | Admitting: Nurse Practitioner

## 2023-06-26 ENCOUNTER — Encounter: Payer: Self-pay | Admitting: Nurse Practitioner

## 2023-06-26 VITALS — BP 136/68 | HR 73 | Temp 97.3°F | Wt 177.0 lb

## 2023-06-26 DIAGNOSIS — Z13228 Encounter for screening for other metabolic disorders: Secondary | ICD-10-CM

## 2023-06-26 DIAGNOSIS — Z23 Encounter for immunization: Secondary | ICD-10-CM | POA: Diagnosis not present

## 2023-06-26 DIAGNOSIS — Z1321 Encounter for screening for nutritional disorder: Secondary | ICD-10-CM

## 2023-06-26 DIAGNOSIS — J309 Allergic rhinitis, unspecified: Secondary | ICD-10-CM | POA: Insufficient documentation

## 2023-06-26 DIAGNOSIS — Z13220 Encounter for screening for lipoid disorders: Secondary | ICD-10-CM | POA: Insufficient documentation

## 2023-06-26 DIAGNOSIS — R7303 Prediabetes: Secondary | ICD-10-CM | POA: Diagnosis not present

## 2023-06-26 DIAGNOSIS — Z1329 Encounter for screening for other suspected endocrine disorder: Secondary | ICD-10-CM | POA: Diagnosis not present

## 2023-06-26 DIAGNOSIS — I10 Essential (primary) hypertension: Secondary | ICD-10-CM | POA: Diagnosis not present

## 2023-06-26 DIAGNOSIS — Z130 Encounter for screening for diseases of the blood and blood-forming organs and certain disorders involving the immune mechanism: Secondary | ICD-10-CM

## 2023-06-26 DIAGNOSIS — D508 Other iron deficiency anemias: Secondary | ICD-10-CM

## 2023-06-26 MED ORDER — LORATADINE 10 MG PO TABS: 10.0000 mg | ORAL_TABLET | Freq: Every day | ORAL | 2 refills | Status: AC

## 2023-06-26 NOTE — Progress Notes (Signed)
 Established Patient Office Visit  Subjective:  Patient ID: Brianna Serrano, female    DOB: May 25, 1970  Age: 53 y.o. MRN: [id number]  CC:  Chief Complaint  Patient presents with   Hypertension    HPI Brianna Serrano is a 53 y.o. female.  has a past medical history of Hypercholesteremia, Hypertension, and Prediabetes.  Patient presents for follow-up for her chronic medical condition  Hypertension.  Currently on amlodipine 2.5 mg daily, states that her blood pressure readings has been well-controlled most of the time at home.  She denies chest pain shortness of breath edema  Allergies  patient complains of stuffiness, scratchy throat, dry cough at night.  Symptoms started about a month ago.  Does not take medications for allergies.  She denies fever, shortness of breath, runny nose  Had  labs ordered at her last visit that have not been done, she would likely have the labs done today Has had a Pap smear about 2 years ago    Past Medical History:  Diagnosis Date   Hypercholesteremia    Hypertension    Prediabetes     Past Surgical History:  Procedure Laterality Date   CESAREAN SECTION      Family History  Problem Relation Age of Onset   Hypertension Father    Stroke Neg Hx    Diabetes Neg Hx    Colon cancer Neg Hx    Breast cancer Neg Hx     Social History   Socioeconomic History   Marital status: Married    Spouse name: Not on file   Number of children: 6   Years of education: Not on file   Highest education level: Not on file  Occupational History   Not on file  Tobacco Use   Smoking status: Never   Smokeless tobacco: Never  Substance and Sexual Activity   Alcohol use: No   Drug use: No   Sexual activity: Yes  Other Topics Concern   Not on file  Social History Narrative   Lives with her husband    Social Drivers of Corporate investment banker Strain: Not on file  Food Insecurity: Not on file  Transportation Needs: Not on file  Physical Activity: Not on file   Stress: Not on file  Social Connections: Not on file  Intimate Partner Violence: Not on file    Outpatient Medications Prior to Visit  Medication Sig Dispense Refill   amLODipine (NORVASC) 2.5 MG tablet Take 1 tablet (2.5 mg total) by mouth daily. 90 tablet 1   Ferrous Sulfate (IRON PO) Take by mouth.     No facility-administered medications prior to visit.    No Known Allergies  ROS Review of Systems  Constitutional:  Negative for appetite change, chills, fatigue and fever.  HENT:  Positive for congestion. Negative for postnasal drip, rhinorrhea and sneezing.   Respiratory:  Positive for cough. Negative for shortness of breath and wheezing.   Cardiovascular:  Negative for chest pain, palpitations and leg swelling.  Gastrointestinal:  Negative for abdominal pain, constipation, nausea and vomiting.  Genitourinary:  Negative for difficulty urinating, dysuria, flank pain and frequency.  Musculoskeletal:  Negative for arthralgias, back pain, joint swelling and myalgias.  Skin:  Negative for color change, pallor, rash and wound.  Neurological:  Negative for dizziness, facial asymmetry, weakness, numbness and headaches.  Psychiatric/Behavioral:  Negative for behavioral problems, confusion, self-injury and suicidal ideas.       Objective:    Physical Exam Vitals and nursing  note reviewed.  Constitutional:      General: She is not in acute distress.    Appearance: Normal appearance. She is obese. She is not ill-appearing, toxic-appearing or diaphoretic.  HENT:     Right Ear: Tympanic membrane, ear canal and external ear normal. There is no impacted cerumen.     Left Ear: Tympanic membrane, ear canal and external ear normal. There is no impacted cerumen.     Nose: Congestion present. No rhinorrhea.     Mouth/Throat:     Mouth: Mucous membranes are moist.     Pharynx: Oropharynx is clear. No oropharyngeal exudate or posterior oropharyngeal erythema.  Eyes:     General: No scleral  icterus.       Right eye: No discharge.        Left eye: No discharge.     Extraocular Movements: Extraocular movements intact.     Conjunctiva/sclera: Conjunctivae normal.  Cardiovascular:     Rate and Rhythm: Normal rate and regular rhythm.     Pulses: Normal pulses.     Heart sounds: Normal heart sounds. No murmur heard.    No friction rub. No gallop.  Pulmonary:     Effort: Pulmonary effort is normal. No respiratory distress.     Breath sounds: Normal breath sounds. No stridor. No wheezing, rhonchi or rales.  Chest:     Chest wall: No tenderness.  Abdominal:     General: There is no distension.     Palpations: Abdomen is soft.     Tenderness: There is no abdominal tenderness. There is no right CVA tenderness, left CVA tenderness or guarding.  Musculoskeletal:        General: No swelling, tenderness, deformity or signs of injury.     Right lower leg: No edema.     Left lower leg: No edema.  Skin:    General: Skin is warm and dry.     Capillary Refill: Capillary refill takes less than 2 seconds.     Coloration: Skin is not jaundiced or pale.     Findings: No bruising, erythema or lesion.  Neurological:     Mental Status: She is alert and oriented to person, place, and time.     Motor: No weakness.     Coordination: Coordination normal.     Gait: Gait normal.  Psychiatric:        Mood and Affect: Mood normal.        Behavior: Behavior normal.        Thought Content: Thought content normal.        Judgment: Judgment normal.     BP 136/68   Pulse 73   Temp (!) 97.3 F (36.3 C)   Wt 177 lb (80.3 kg)   SpO2 100%   BMI 31.35 kg/m  Wt Readings from Last 3 Encounters:  06/26/23 177 lb (80.3 kg)  12/26/22 179 lb 9.6 oz (81.5 kg)  09/26/22 178 lb (80.7 kg)    No results found for: "TSH" Lab Results  Component Value Date   WBC 5.8 09/19/2022   HGB 11.3 (L) 09/19/2022   HCT 36.0 09/19/2022   MCV 80.0 09/19/2022   PLT 290 09/19/2022   Lab Results  Component Value  Date   NA 137 09/19/2022   K 3.9 09/19/2022   CO2 24 09/19/2022   GLUCOSE 91 09/19/2022   BUN 13 09/19/2022   CREATININE 0.76 09/19/2022   BILITOT 0.4 05/10/2009   ALKPHOS 91 05/10/2009   AST 17 05/10/2009  ALT 12 05/10/2009   PROT 7.8 05/10/2009   ALBUMIN 4.0 05/10/2009   CALCIUM 9.2 09/19/2022   ANIONGAP 7 09/19/2022   No results found for: "CHOL" No results found for: "HDL" No results found for: "LDLCALC" No results found for: "TRIG" No results found for: "CHOLHDL" No results found for: "HGBA1C"    Assessment & Plan:   Problem List Items Addressed This Visit       Cardiovascular and Mediastinum   Primary hypertension - Primary   BP Readings from Last 3 Encounters:  06/26/23 136/68  12/26/22 132/60  09/26/22 128/64   HTN Controlled .  On amlodipine 2.5 mg daily Continue current medications. No changes in management. Discussed DASH diet and dietary sodium restrictions Continue to increase dietary efforts and exercise.  Follow-up in 6 months        Relevant Orders   Basic Metabolic Panel     Respiratory   Allergic rhinitis   Avoid allergens - loratadine (CLARITIN) 10 MG tablet; Take 1 tablet (10 mg total) by mouth daily.  Dispense: 30 tablet; Refill: 2       Relevant Medications   loratadine (CLARITIN) 10 MG tablet     Other   Anemia, due to inadequate iron intake   Takes ferrous sulfate on some days, she denies fatigue Rechecking labs      Relevant Orders   CBC   Iron, TIBC and Ferritin Panel   Screening for lipid disorders   Relevant Orders   Lipid panel   Prediabetes   history of prediabetes Checking A1c      Relevant Orders   Hemoglobin A1c   Other Visit Diagnoses       Screening for endocrine, nutritional, metabolic and immunity disorder           Meds ordered this encounter  Medications   loratadine (CLARITIN) 10 MG tablet    Sig: Take 1 tablet (10 mg total) by mouth daily.    Dispense:  30 tablet    Refill:  2     Follow-up: Return in about 7 months (around 01/26/2024) for CPE.    Ivis Henneman R Blythe Hartshorn, FNP

## 2023-06-26 NOTE — Addendum Note (Signed)
 Addended by: Julian Obey on: 06/26/2023 04:36 PM   Modules accepted: Orders

## 2023-06-26 NOTE — Assessment & Plan Note (Addendum)
 BP Readings from Last 3 Encounters:  06/26/23 136/68  12/26/22 132/60  09/26/22 128/64   HTN Controlled .  On amlodipine 2.5 mg daily Continue current medications. No changes in management. Discussed DASH diet and dietary sodium restrictions Continue to increase dietary efforts and exercise.  Follow-up in 6 months

## 2023-06-26 NOTE — Assessment & Plan Note (Addendum)
 Avoid allergens - loratadine (CLARITIN) 10 MG tablet; Take 1 tablet (10 mg total) by mouth daily.  Dispense: 30 tablet; Refill: 2

## 2023-06-26 NOTE — Assessment & Plan Note (Signed)
 Takes ferrous sulfate on some days, she denies fatigue Rechecking labs

## 2023-06-26 NOTE — Patient Instructions (Signed)
.   Primary hypertension (Primary)  - Basic Metabolic Panel  . Anemia, due to inadequate iron intake  - CBC - Iron, TIBC and Ferritin Panel     Prediabetes  - Hemoglobin A1c   Screening for lipid disorders  - Lipid panel   Allergic rhinitis, unspecified seasonality, unspecified trigger  - loratadine (CLARITIN) 10 MG tablet; Take 1 tablet (10 mg total) by mouth daily.  Dispense: 30 tablet; Refill: 2    It is important that you exercise regularly at least 30 minutes 5 times a week as tolerated  Think about what you will eat, plan ahead. Choose " clean, green, fresh or frozen" over canned, processed or packaged foods which are more sugary, salty and fatty. 70 to 75% of food eaten should be vegetables and fruit. Three meals at set times with snacks allowed between meals, but they must be fruit or vegetables. Aim to eat over a 12 hour period , example 7 am to 7 pm, and STOP after  your last meal of the day. Drink water,generally about 64 ounces per day, no other drink is as healthy. Fruit juice is best enjoyed in a healthy way, by EATING the fruit.  Thanks for choosing [facility] we consider it a privelige to serve you.

## 2023-06-26 NOTE — Assessment & Plan Note (Addendum)
 history of prediabetes Checking A1c

## 2023-06-27 LAB — CBC
Hematocrit: 36.7 % (ref 34.0–46.6)
Hemoglobin: 11.5 g/dL (ref 11.1–15.9)
MCH: 25.7 pg — ABNORMAL LOW (ref 26.6–33.0)
MCHC: 31.3 g/dL — ABNORMAL LOW (ref 31.5–35.7)
MCV: 82 fL (ref 79–97)
Platelets: 297 10*3/uL (ref 150–450)
RBC: 4.47 x10E6/uL (ref 3.77–5.28)
RDW: 14.5 % (ref 11.7–15.4)
WBC: 5.2 10*3/uL (ref 3.4–10.8)

## 2023-06-27 LAB — BASIC METABOLIC PANEL WITH GFR
BUN/Creatinine Ratio: 13 (ref 9–23)
BUN: 11 mg/dL (ref 6–24)
CO2: 25 mmol/L (ref 20–29)
Calcium: 9.7 mg/dL (ref 8.7–10.2)
Chloride: 102 mmol/L (ref 96–106)
Creatinine, Ser: 0.84 mg/dL (ref 0.57–1.00)
Glucose: 85 mg/dL (ref 70–99)
Potassium: 4.3 mmol/L (ref 3.5–5.2)
Sodium: 139 mmol/L (ref 134–144)
eGFR: 84 mL/min/{1.73_m2} (ref >59)

## 2023-06-27 LAB — LIPID PANEL
Chol/HDL Ratio: 3.4 ratio (ref 0.0–4.4)
Cholesterol, Total: 239 mg/dL — ABNORMAL HIGH (ref 100–199)
HDL: 71 mg/dL (ref >39)
LDL Chol Calc (NIH): 161 mg/dL — ABNORMAL HIGH (ref 0–99)
Triglycerides: 47 mg/dL (ref 0–149)
VLDL Cholesterol Cal: 7 mg/dL (ref 5–40)

## 2023-06-27 LAB — HEMOGLOBIN A1C
Est. average glucose Bld gHb Est-mCnc: 126 mg/dL
Hgb A1c MFr Bld: 6 % — ABNORMAL HIGH (ref 4.8–5.6)

## 2023-06-27 LAB — IRON,TIBC AND FERRITIN PANEL
Ferritin: 72 ng/mL (ref 15–150)
Iron Saturation: 16 % (ref 15–55)
Iron: 57 ug/dL (ref 27–159)
Total Iron Binding Capacity: 346 ug/dL (ref 250–450)
UIBC: 289 ug/dL (ref 131–425)

## 2023-06-30 IMAGING — DX DG CHEST 2V
2 series · 2 of 2 positions shown · non-contrast
Comparison: 11/02/2020 and CT chest 11/02/2020.

CLINICAL DATA: Chest pain, left-sided rib pain, left axillary pain
and left scapular pain. No injury.

EXAM:
CHEST - 2 VIEW

[chest pa]
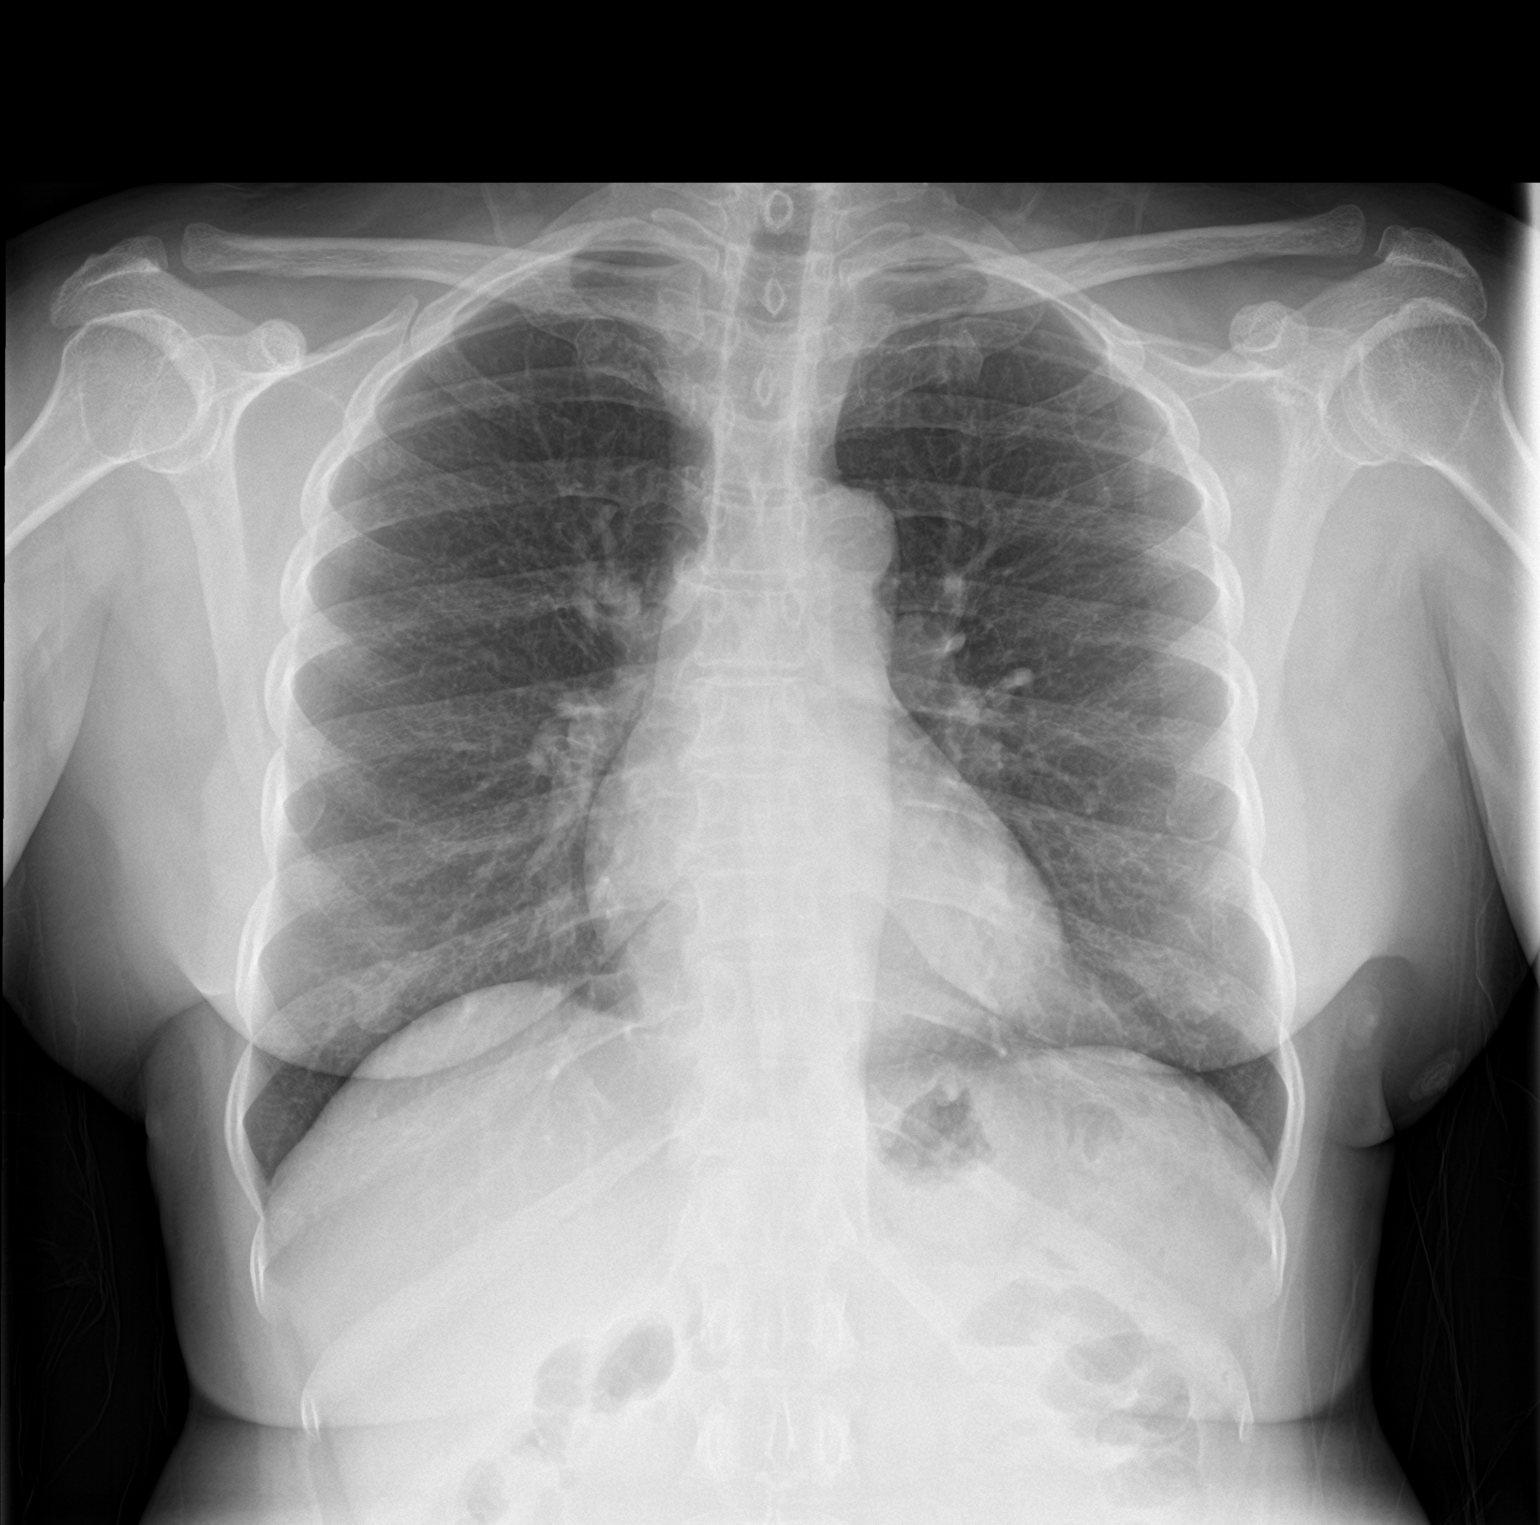

[chest lat]
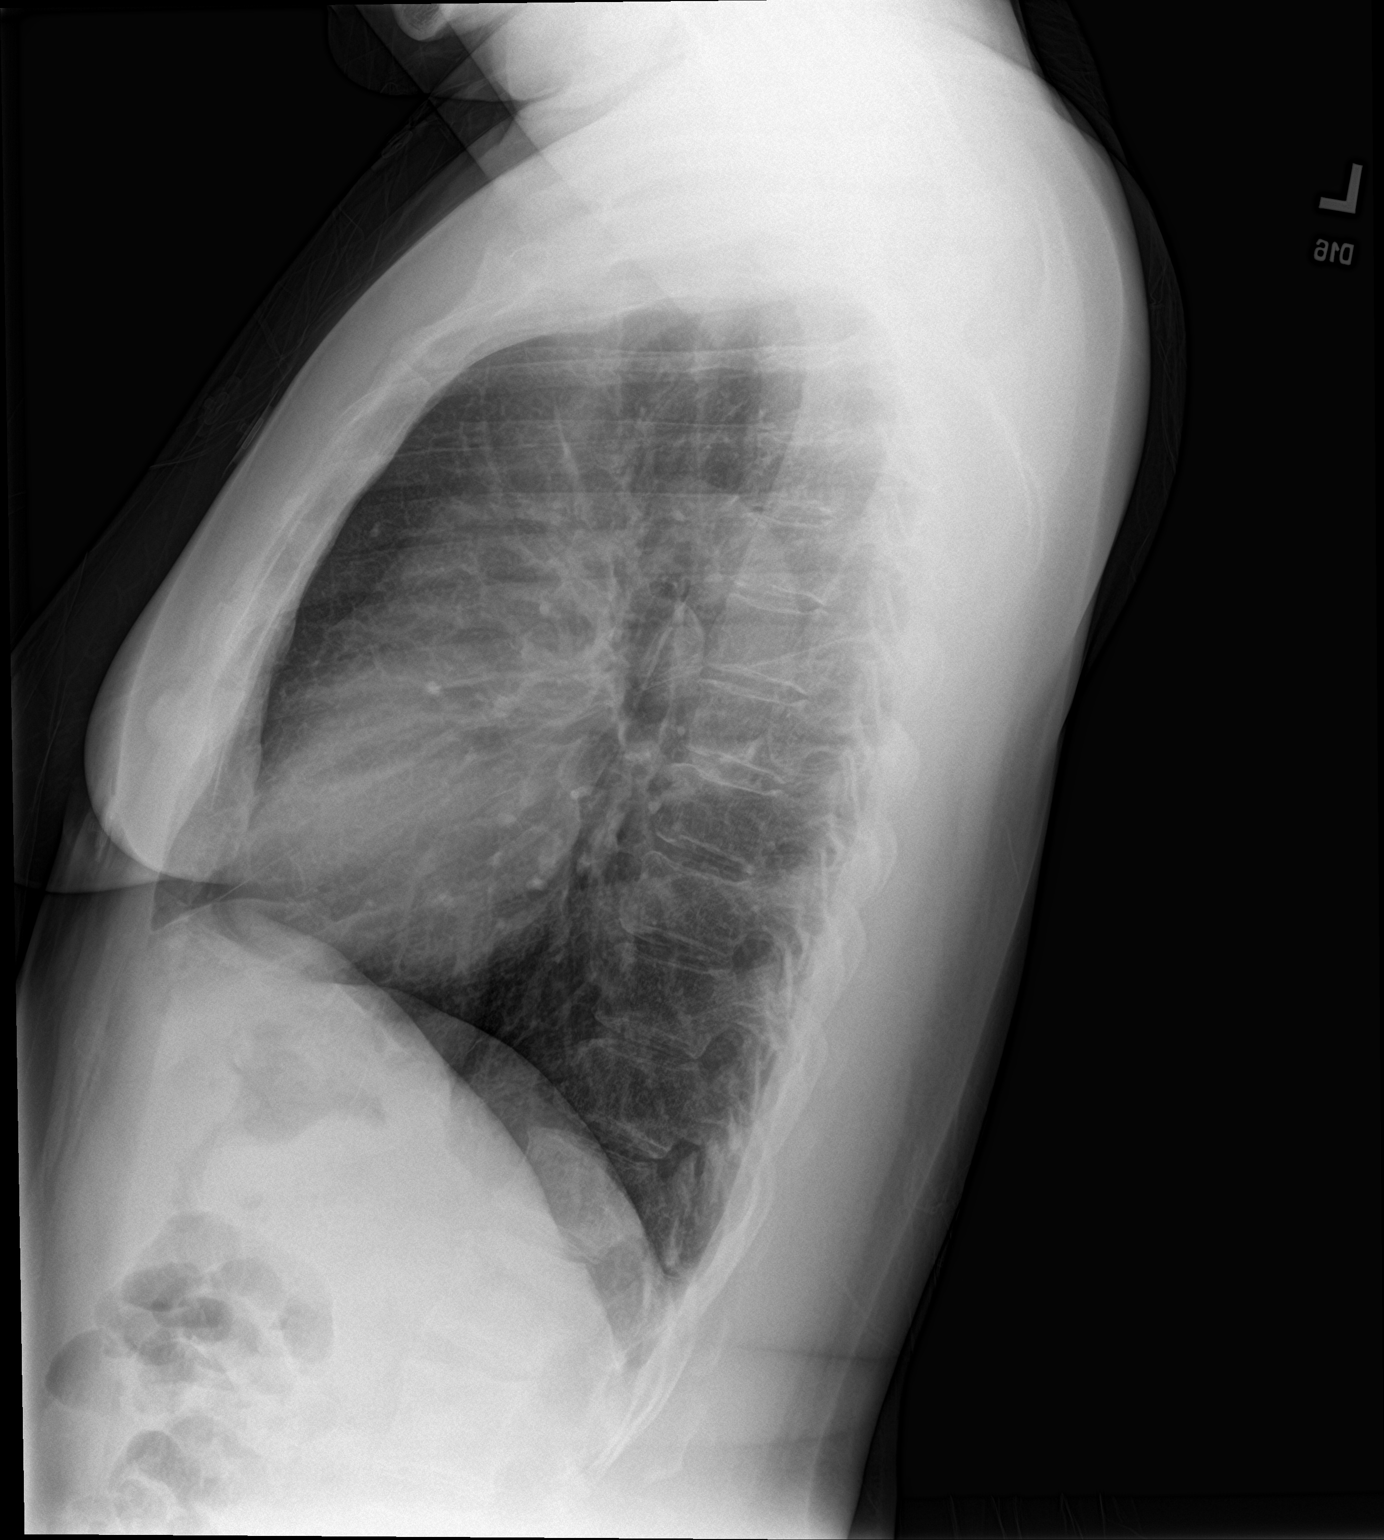

[2 of 2 positions shown; findings below may reference images not displayed]

FINDINGS: Trachea is midline. Heart size normal. Lungs are clear. No pleural
fluid. Osseous structures appear grossly intact.
IMPRESSION: No acute findings.

## 2024-01-27 ENCOUNTER — Ambulatory Visit (INDEPENDENT_AMBULATORY_CARE_PROVIDER_SITE_OTHER): Payer: Self-pay | Admitting: Nurse Practitioner

## 2024-01-27 ENCOUNTER — Encounter: Payer: Self-pay | Admitting: Nurse Practitioner

## 2024-01-27 VITALS — BP 147/69 | HR 72 | Ht 63.0 in | Wt 174.0 lb

## 2024-01-27 DIAGNOSIS — D508 Other iron deficiency anemias: Secondary | ICD-10-CM

## 2024-01-27 DIAGNOSIS — E785 Hyperlipidemia, unspecified: Secondary | ICD-10-CM | POA: Insufficient documentation

## 2024-01-27 DIAGNOSIS — Z0000 Encounter for general adult medical examination without abnormal findings: Secondary | ICD-10-CM | POA: Diagnosis not present

## 2024-01-27 DIAGNOSIS — I10 Essential (primary) hypertension: Secondary | ICD-10-CM

## 2024-01-27 DIAGNOSIS — H543 Unqualified visual loss, both eyes: Secondary | ICD-10-CM

## 2024-01-27 DIAGNOSIS — R7303 Prediabetes: Secondary | ICD-10-CM

## 2024-01-27 DIAGNOSIS — E669 Obesity, unspecified: Secondary | ICD-10-CM | POA: Insufficient documentation

## 2024-01-27 MED ORDER — AMLODIPINE BESYLATE 2.5 MG PO TABS: 2.5000 mg | ORAL_TABLET | Freq: Every day | ORAL | 1 refills | Status: AC

## 2024-01-27 NOTE — Assessment & Plan Note (Addendum)
 BP Readings from Last 3 Encounters:  01/27/24 (!) 147/69  06/26/23 136/68  12/26/22 132/60   Blood pressure readings at home range from 129/70 to 149/80. Current reading in office is 147/69, likely due to not taking medication yet. Discussed medication adherence and proper blood pressure measurement. Explained hypertension thresholds. Discussed potential medication dosage increase if systolic remains high. - Continue Amlodipine  2.5 mg daily. - Check blood pressure 2-3 hours after taking medication. - Report if systolic consistently exceeds 859 or diastolic exceeds 90. - Consider increasing Amlodipine  to 5 mg daily if systolic remains high despite lifestyle changes. Counseled on DASH diet, and encouraged regular moderate exercise at least  150 minutes weekly as tolerated Nurse visit in 4 weeks for blood pressure check

## 2024-01-27 NOTE — Progress Notes (Signed)
 Complete physical exam  Patient: Brianna Serrano   DOB: Oct 10, 1970   53 y.o. Female  MRN: [id number]  Subjective:    Chief Complaint  Patient presents with   Annual Exam    fasting       Discussed the use of AI scribe software for clinical note transcription with the patient, who gave verbal consent to proceed.  History of Present Illness Brianna Serrano is a 53 year old female with hypertension and prediabetes who presents for an annual physical exam.  She monitors her blood pressure at home, typically recording readings around 134/80 mmHg, with occasional systolic spikes up to 149 mmHg. Her diastolic pressure remains low, often between 70-80 mmHg. Eating late at night causes her morning blood pressure to rise. She takes amlodipine  2.5 mg daily in the morning after 10 o'clock but did not take it before today's appointment.  She does not engage in regular exercise but has a treadmill at home that she sometimes uses. 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 She follows a fasting diet, eating between 10 AM and 6 PM, but sometimes eats late due to work commitments, which affects her blood pressure.  She has a history of prediabetes with a previous A1c of 6.0. She stopped taking iron supplements three months ago and is not currently taking any allergy medication.  No fever, chills, chest pain, shortness of breath, abdominal pain, nausea, or vomiting. She experiences blurry vision and uses reading glasses but has not seen an eye doctor recently.    Assessment & Plan .     Most recent fall risk assessment:    09/26/2022    1:46 PM  Fall Risk   Falls in the past year? 0  Number falls in past yr: 0  Injury with Fall? 0  Risk for fall due to : No Fall Risks  Follow up Falls evaluation completed     Most recent depression screenings:    01/27/2024   10:27 AM 06/26/2023    9:53 AM  PHQ 2/9 Scores  PHQ - 2 Score 0 0  PHQ- 9 Score 0          Patient Care Team: Remi Rester R, FNP as PCP - General (Nurse Practitioner)   Outpatient Medications Prior to Visit  Medication Sig   [DISCONTINUED] amLODipine  (NORVASC ) 2.5 MG tablet Take 1 tablet (2.5 mg total) by mouth daily.   Ferrous Sulfate (IRON PO) Take by mouth. (Patient not taking: Reported on 01/27/2024)   loratadine  (CLARITIN ) 10 MG tablet Take 1 tablet (10 mg total) by mouth daily. (Patient not taking: Reported on 01/27/2024)   No facility-administered medications prior to visit.    Review of Systems  Constitutional:  Negative for appetite change, chills, fatigue and fever.  HENT:  Negative for congestion, postnasal drip, rhinorrhea and sneezing.   Eyes:  Positive for visual disturbance. Negative for pain, discharge and itching.  Respiratory:  Negative for cough, shortness of breath and wheezing.   Cardiovascular:  Negative for chest pain, palpitations and leg swelling.  Gastrointestinal:  Negative for abdominal pain, constipation, nausea and vomiting.  Endocrine: Negative for cold intolerance, heat intolerance and polydipsia.  Genitourinary:  Negative for difficulty urinating, dysuria, flank pain and frequency.  Musculoskeletal:  Negative for arthralgias, back pain, joint swelling and myalgias.  Skin:  Negative for color change, pallor, rash and wound.  Neurological:  Negative for dizziness, facial asymmetry, weakness, numbness and headaches.  Psychiatric/Behavioral:  Negative  for behavioral problems, confusion, self-injury and suicidal ideas.        Objective:     BP (!) 147/69   Pulse 72   Ht 5' 3 (1.6 m)   Wt 174 lb (78.9 kg)   SpO2 100%   BMI 30.82 kg/m    Physical Exam Vitals and nursing note reviewed. Exam conducted with a chaperone present.  Constitutional:      General: She is not in acute distress.    Appearance: Normal appearance. She is obese. She is not ill-appearing, toxic-appearing or diaphoretic.  HENT:     Right Ear:  Tympanic membrane, ear canal and external ear normal. There is no impacted cerumen.     Left Ear: Tympanic membrane, ear canal and external ear normal. There is no impacted cerumen.     Nose: Nose normal. No congestion or rhinorrhea.     Mouth/Throat:     Mouth: Mucous membranes are moist.     Pharynx: Oropharynx is clear. No oropharyngeal exudate or posterior oropharyngeal erythema.  Eyes:     General: No scleral icterus.       Right eye: No discharge.        Left eye: No discharge.     Extraocular Movements: Extraocular movements intact.     Conjunctiva/sclera: Conjunctivae normal.  Neck:     Vascular: No carotid bruit.  Cardiovascular:     Rate and Rhythm: Normal rate and regular rhythm.     Pulses: Normal pulses.     Heart sounds: Normal heart sounds. No murmur heard.    No friction rub. No gallop.  Pulmonary:     Effort: Pulmonary effort is normal. No respiratory distress.     Breath sounds: Normal breath sounds. No stridor. No wheezing, rhonchi or rales.  Chest:     Chest wall: No mass, lacerations, deformity, swelling, tenderness, crepitus or edema.  Breasts:    Tanner Score is 5.     Right: Normal. No swelling, bleeding, inverted nipple, mass, nipple discharge, skin change or tenderness.     Left: Normal. No swelling, bleeding, inverted nipple, mass, nipple discharge, skin change or tenderness.  Abdominal:     General: Bowel sounds are normal. There is no distension.     Palpations: Abdomen is soft. There is no mass.     Tenderness: There is no abdominal tenderness. There is no right CVA tenderness, left CVA tenderness, guarding or rebound.     Hernia: No hernia is present.  Musculoskeletal:        General: No swelling, tenderness, deformity or signs of injury.     Cervical back: Normal range of motion and neck supple. No rigidity or tenderness.     Right lower leg: No edema.     Left lower leg: No edema.  Lymphadenopathy:     Cervical: No cervical adenopathy.      Upper Body:     Right upper body: No supraclavicular, axillary or pectoral adenopathy.     Left upper body: No supraclavicular, axillary or pectoral adenopathy.  Skin:    General: Skin is warm and dry.     Capillary Refill: Capillary refill takes less than 2 seconds.     Coloration: Skin is not jaundiced or pale.     Findings: No bruising, erythema, lesion or rash.  Neurological:     Mental Status: She is alert and oriented to person, place, and time.     Cranial Nerves: No cranial nerve deficit.     Motor:  No weakness.     Gait: Gait normal.  Psychiatric:        Mood and Affect: Mood normal.        Behavior: Behavior normal.        Thought Content: Thought content normal.        Judgment: Judgment normal.     No results found for any visits on 01/27/24.     Assessment & Plan:    Routine Health Maintenance and Physical Exam  Immunization History  Administered Date(s) Administered   Influenza, Seasonal, Injecte, Preservative Fre 01/08/2017, 03/27/2018   PFIZER(Purple Top)SARS-COV-2 Vaccination 07/04/2019, 08/01/2019   Td (Adult) 01/08/2017   Tdap 09/26/2022   Zoster Recombinant(Shingrix ) 09/26/2022, 06/26/2023    Health Maintenance  Topic Date Due   Hepatitis B Vaccines 19-59 Average Risk (1 of 3 - 19+ 3-dose series) Never done   Cervical Cancer Screening (HPV/Pap Cotest)  08/16/2015   COVID-19 Vaccine (3 - 2025-26 season) 11/11/2023   Influenza Vaccine  06/09/2024 (Originally 10/11/2023)   Pneumococcal Vaccine: 50+ Years (1 of 1 - PCV) 01/26/2025 (Originally 11/04/2020)   Mammogram  10/30/2024   Fecal DNA (Cologuard)  01/28/2026   DTaP/Tdap/Td (2 - Td or Tdap) 09/25/2032   Zoster Vaccines- Shingrix   Completed   HPV VACCINES  Aged Out   Meningococcal B Vaccine  Aged Out   Hepatitis C Screening  Discontinued   HIV Screening  Discontinued    Discussed health benefits of physical activity, and encouraged her to engage in regular exercise appropriate for her age and  condition.  Problem List Items Addressed This Visit       Cardiovascular and Mediastinum   Primary hypertension   BP Readings from Last 3 Encounters:  01/27/24 (!) 147/69  06/26/23 136/68  12/26/22 132/60   Blood pressure readings at home range from 129/70 to 149/80. Current reading in office is 147/69, likely due to not taking medication yet. Discussed medication adherence and proper blood pressure measurement. Explained hypertension thresholds. Discussed potential medication dosage increase if systolic remains high. - Continue Amlodipine  2.5 mg daily. - Check blood pressure 2-3 hours after taking medication. - Report if systolic consistently exceeds 859 or diastolic exceeds 90. - Consider increasing Amlodipine  to 5 mg daily if systolic remains high despite lifestyle changes. Counseled on DASH diet, and encouraged regular moderate exercise at least  150 minutes weekly as tolerated       Relevant Medications   amLODipine  (NORVASC ) 2.5 MG tablet   Other Relevant Orders   CMP14+EGFR   CBC   Recheck vitals     Other   Anemia, due to inadequate iron intake   Lab Results  Component Value Date   WBC 5.2 06/26/2023   HGB 11.5 06/26/2023   HCT 36.7 06/26/2023   MCV 82 06/26/2023   PLT 297 06/26/2023    Lab Results  Component Value Date   IRON 57 06/26/2023   TIBC 346 06/26/2023   FERRITIN 72 06/26/2023    Stopped taking iron supplements 3 months ago. Plan to check blood count and iron levels. - Ordered CBC and iron studies.       Annual physical exam - Primary   Adult Wellness Visit Annual physical examination conducted. Discussed lifestyle modifications including exercise, diet, and sleep. Emphasized heart-healthy diet and regular exercise for blood pressure management. Discussed seatbelt use and firearm safety. - Encouraged regular exercise, starting with 10 minutes and gradually increasing to 30 minutes, 5 days a week. - Advised on a heart-healthy, low  salt, low fat  diet. - Recommended 7-8 hours of sleep nightly. - Encouraged hydration with at least 64 ounces of water daily. - Advised on seatbelt use and firearm safety. 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 Has not seen an eye doctor recently. - Referred to an eye doctor for evaluation, ophthalmology contact information provided      Prediabetes   Lab Results  Component Value Date   HGBA1C 6.0 (H) 06/26/2023   Previous A1c was 6.0. Discussed diet and exercise for blood sugar management. Advised to avoid sugar, sweets, and soda. - Ordered A1c test. - Advised on avoiding sugar, sweets, and soda. - Encouraged regular exercise.       Relevant Orders   Hemoglobin A1c   Hyperlipidemia   Lab Results  Component Value Date   CHOL 239 (H) 06/26/2023   HDL 71 06/26/2023   LDLCALC 161 (H) 06/26/2023   TRIG 47 06/26/2023   CHOLHDL 3.4 06/26/2023   heart healthy low-fat diet encouraged - Ordered cholesterol test.       Relevant Medications   amLODipine  (NORVASC ) 2.5 MG tablet   Other Relevant Orders   Lipid panel   Obesity (BMI 30-39.9)   Counseled on a low-carb diet Encouraged regular moderate exercises at least 150 minutes weekly as tolerated Wt Readings from Last 3 Encounters:  01/27/24 174 lb (78.9 kg)  06/26/23 177 lb (80.3 kg)  12/26/22 179 lb 9.6 oz (81.5 kg)   Body mass index is 30.82 kg/m.       Return in about 4 months (around 05/26/2024) for HTN.     Santa Abdelrahman R Malissie Musgrave, FNP

## 2024-01-27 NOTE — Assessment & Plan Note (Addendum)
 Adult Wellness Visit Annual physical examination conducted. Discussed lifestyle modifications including exercise, diet, and sleep. Emphasized heart-healthy diet and regular exercise for blood pressure management. Discussed seatbelt use and firearm safety. - Encouraged regular exercise, starting with 10 minutes and gradually increasing to 30 minutes, 5 days a week. - Advised on a heart-healthy, low salt, low fat diet. - Recommended 7-8 hours of sleep nightly. - Encouraged hydration with at least 64 ounces of water daily. - Advised on seatbelt use and firearm safety. Encouraged Flu vaccine and Pneumococcal vaccines  Prefers to do mammogram every 2 years instead of yearly Up to date with PAP smear,Requested for records from health dept

## 2024-01-27 NOTE — Assessment & Plan Note (Signed)
 Counseled on a low-carb diet Encouraged regular moderate exercises at least 150 minutes weekly as tolerated Wt Readings from Last 3 Encounters:  01/27/24 174 lb (78.9 kg)  06/26/23 177 lb (80.3 kg)  12/26/22 179 lb 9.6 oz (81.5 kg)   Body mass index is 30.82 kg/m.

## 2024-01-27 NOTE — Patient Instructions (Addendum)
 [address] [place] [address]  P:  [phone] F:  [phone]    It is important that you exercise regularly at least 30 minutes 5 times a week as tolerated  Think about what you will eat, plan ahead. Choose " clean, green, fresh or frozen" over canned, processed or packaged foods which are more sugary, salty and fatty. 70 to 75% of food eaten should be vegetables and fruit. Three meals at set times with snacks allowed between meals, but they must be fruit or vegetables. Aim to eat over a 12 hour period , example 7 am to 7 pm, and STOP after  your last meal of the day. Drink water ,generally about 64 ounces per day, no other drink is as healthy. Fruit juice is best enjoyed in a healthy way, by EATING the fruit.  Thanks for choosing [facility] we consider it a privelige to serve you.

## 2024-01-27 NOTE — Assessment & Plan Note (Signed)
 Blurry vision Reports blurry vision with reading glasses. Has not seen an eye doctor recently. - Referred to an eye doctor for evaluation, ophthalmology contact information provided

## 2024-01-27 NOTE — Assessment & Plan Note (Signed)
 Lab Results  Component Value Date   WBC 5.2 06/26/2023   HGB 11.5 06/26/2023   HCT 36.7 06/26/2023   MCV 82 06/26/2023   PLT 297 06/26/2023    Lab Results  Component Value Date   IRON 57 06/26/2023   TIBC 346 06/26/2023   FERRITIN 72 06/26/2023    Stopped taking iron supplements 3 months ago. Plan to check blood count and iron levels. - Ordered CBC and iron studies.

## 2024-01-27 NOTE — Assessment & Plan Note (Signed)
 Lab Results  Component Value Date   CHOL 239 (H) 06/26/2023   HDL 71 06/26/2023   LDLCALC 161 (H) 06/26/2023   TRIG 47 06/26/2023   CHOLHDL 3.4 06/26/2023   heart healthy low-fat diet encouraged - Ordered cholesterol test.

## 2024-01-27 NOTE — Assessment & Plan Note (Signed)
 Lab Results  Component Value Date   HGBA1C 6.0 (H) 06/26/2023   Previous A1c was 6.0. Discussed diet and exercise for blood sugar management. Advised to avoid sugar, sweets, and soda. - Ordered A1c test. - Advised on avoiding sugar, sweets, and soda. - Encouraged regular exercise.

## 2024-01-28 ENCOUNTER — Ambulatory Visit: Payer: Self-pay | Admitting: Nurse Practitioner

## 2024-01-28 LAB — LIPID PANEL
Chol/HDL Ratio: 3.4 ratio (ref 0.0–4.4)
Cholesterol, Total: 231 mg/dL — ABNORMAL HIGH (ref 100–199)
HDL: 67 mg/dL (ref >39)
LDL Chol Calc (NIH): 155 mg/dL — ABNORMAL HIGH (ref 0–99)
Triglycerides: 56 mg/dL (ref 0–149)
VLDL Cholesterol Cal: 9 mg/dL (ref 5–40)

## 2024-01-28 LAB — CMP14+EGFR
ALT: 10 IU/L (ref 0–32)
AST: 17 IU/L (ref 0–40)
Albumin: 4.6 g/dL (ref 3.8–4.9)
Alkaline Phosphatase: 102 IU/L (ref 49–135)
BUN/Creatinine Ratio: 18 (ref 9–23)
BUN: 13 mg/dL (ref 6–24)
Bilirubin Total: 0.2 mg/dL (ref 0.0–1.2)
CO2: 25 mmol/L (ref 20–29)
Calcium: 9.5 mg/dL (ref 8.7–10.2)
Chloride: 101 mmol/L (ref 96–106)
Creatinine, Ser: 0.71 mg/dL (ref 0.57–1.00)
Globulin, Total: 3 g/dL (ref 1.5–4.5)
Glucose: 81 mg/dL (ref 70–99)
Potassium: 4.1 mmol/L (ref 3.5–5.2)
Sodium: 139 mmol/L (ref 134–144)
Total Protein: 7.6 g/dL (ref 6.0–8.5)
eGFR: 102 mL/min/1.73 (ref >59)

## 2024-01-28 LAB — CBC
Hematocrit: 35.8 % (ref 34.0–46.6)
Hemoglobin: 11.3 g/dL (ref 11.1–15.9)
MCH: 25.8 pg — ABNORMAL LOW (ref 26.6–33.0)
MCHC: 31.6 g/dL (ref 31.5–35.7)
MCV: 82 fL (ref 79–97)
Platelets: 296 x10E3/uL (ref 150–450)
RBC: 4.38 x10E6/uL (ref 3.77–5.28)
RDW: 14.7 % (ref 11.7–15.4)
WBC: 5.6 x10E3/uL (ref 3.4–10.8)

## 2024-01-28 LAB — HEMOGLOBIN A1C
Est. average glucose Bld gHb Est-mCnc: 117 mg/dL
Hgb A1c MFr Bld: 5.7 % — ABNORMAL HIGH (ref 4.8–5.6)

## 2024-02-24 ENCOUNTER — Ambulatory Visit: Payer: Self-pay

## 2024-06-29 ENCOUNTER — Ambulatory Visit: Payer: Self-pay | Admitting: Nurse Practitioner
